# Patient Record
Sex: Female | Born: 1970
Health system: Northeastern US, Community
[De-identification: ages and names within clinical notes are randomized; demographics above are authoritative.]

## PROBLEM LIST (undated history)

## (undated) DIAGNOSIS — R51 Headache: Principal | ICD-10-CM

## (undated) DIAGNOSIS — R519 Headache, unspecified: Secondary | ICD-10-CM

## (undated) HISTORY — PX: SALPINGECTOMY COMPLETE/PARTIAL UNI/BI SPX: REP214

---

## 2016-07-01 ENCOUNTER — Telehealth (HOSPITAL_BASED_OUTPATIENT_CLINIC_OR_DEPARTMENT_OTHER): Payer: Self-pay | Admitting: Physician Assistant

## 2016-07-01 NOTE — Progress Notes (Signed)
Shawna from [employer] contacted the Central Refill Department to complete a benefit analysis for the Tdap Vaccine.    The vaccine is covered under the patient’s prescription coverage.    Please choose [id number] Tdap (Prior Auth/Pharmacy)

## 2016-07-10 ENCOUNTER — Ambulatory Visit (HOSPITAL_BASED_OUTPATIENT_CLINIC_OR_DEPARTMENT_OTHER): Payer: Medicaid Other | Admitting: Physician Assistant

## 2016-07-31 MED FILL — *METFORMIN  500MG ER: 30 days supply | Qty: 60 | Fill #1 | Status: CP

## 2016-07-31 MED FILL — *VITAMIN D3 2000UNIT: 30 days supply | Qty: 30 | Fill #9 | Status: CP

## 2016-07-31 MED FILL — TRI-PREVIFEM: 84 days supply | Qty: 84 | Fill #0 | Status: CP

## 2016-08-28 ENCOUNTER — Ambulatory Visit (HOSPITAL_BASED_OUTPATIENT_CLINIC_OR_DEPARTMENT_OTHER): Payer: Medicaid Other | Admitting: Physician Assistant

## 2016-09-07 ENCOUNTER — Emergency Department (HOSPITAL_BASED_OUTPATIENT_CLINIC_OR_DEPARTMENT_OTHER)
Admission: RE | Admit: 2016-09-07 | Disposition: A | Discharge status: Home / Self Care | Payer: Self-pay | Source: Emergency Department | Attending: Emergency Medicine | Admitting: Emergency Medicine

## 2016-09-07 ENCOUNTER — Encounter (HOSPITAL_BASED_OUTPATIENT_CLINIC_OR_DEPARTMENT_OTHER): Payer: Self-pay

## 2016-09-07 HISTORY — DX: Headache: R51

## 2016-09-07 HISTORY — DX: Headache, unspecified: R51.9

## 2016-09-07 LAB — POC URINALYSIS
BILIRUBIN, URINE: NEGATIVE
GLUCOSE,URINE: NEGATIVE
KETONE, URINE: NEGATIVE
LEUKOCYTE ESTERASE: NEGATIVE
NITRITE, URINE: NEGATIVE
PH URINE: 7 (ref 5.0–8.0)
PROTEIN, URINE: 100 — AB
SPECIFIC GRAVITY, URINE: 1.01 (ref 1.003–1.030)
UROBILINOGEN URINE: 0.2 (ref 0.2–1.0)

## 2016-09-07 LAB — CBC, PLATELET & DIFFERENTIAL
ABSOLUTE BASO COUNT: 0 10*3/uL (ref 0.0–0.1)
ABSOLUTE EOSINOPHIL COUNT: 0.1 10*3/uL (ref 0.0–0.8)
ABSOLUTE IMM GRAN COUNT: 0.01 10*3/uL (ref 0.00–0.03)
ABSOLUTE LYMPH COUNT: 2.3 10*3/uL (ref 0.6–5.9)
ABSOLUTE MONO COUNT: 0.5 10*3/uL (ref 0.2–1.4)
ABSOLUTE NEUTROPHIL COUNT: 4.3 10*3/uL (ref 1.6–8.3)
BASOPHIL %: 0.4 % (ref 0.0–1.2)
EOSINOPHIL %: 1.9 % (ref 0.0–7.0)
HEMATOCRIT: 36.8 % (ref 34.1–44.9)
HEMOGLOBIN: 12.8 g/dL (ref 11.2–15.7)
IMMATURE GRANULOCYTE %: 0.1 % (ref 0.0–0.4)
LYMPHOCYTE %: 31.2 % (ref 15.0–54.0)
MEAN CORP HGB CONC: 34.8 g/dL (ref 31.0–37.0)
MEAN CORPUSCULAR HGB: 29.8 pg (ref 26.0–34.0)
MEAN CORPUSCULAR VOL: 85.6 fL (ref 80.0–100.0)
MEAN PLATELET VOLUME: 11.4 fL (ref 8.7–12.5)
MONOCYTE %: 7 % (ref 4.0–13.0)
NEUTROPHIL %: 59.4 % (ref 40.0–75.0)
PLATELET COUNT: 273 10*3/uL (ref 150–400)
RBC DISTRIBUTION WIDTH STD DEV: 38.3 fL (ref 35.1–46.3)
RBC DISTRIBUTION WIDTH: 12.5 % (ref 11.5–14.3)
RED BLOOD CELL COUNT: 4.3 M/uL (ref 3.90–5.20)
WHITE BLOOD CELL COUNT: 7.3 10*3/uL (ref 4.0–11.0)

## 2016-09-07 LAB — BASIC METABOLIC PANEL
ANION GAP: 9 mmol/L (ref 5–15)
BUN (UREA NITROGEN): 8 mg/dL (ref 7–18)
CALCIUM: 8.4 mg/dL — ABNORMAL LOW (ref 8.5–10.1)
CARBON DIOXIDE: 27 mmol/L (ref 21–32)
CHLORIDE: 107 mmol/L (ref 98–107)
CREATININE: 0.8 mg/dL (ref 0.4–1.2)
ESTIMATED GLOMERULAR FILT RATE: >60 mL/min (ref >60)
Glucose Random: 96 mg/dL (ref 74–160)
POTASSIUM: 4.3 mmol/L (ref 3.5–5.1)
SODIUM: 143 mmol/L (ref 136–145)

## 2016-09-07 LAB — URINE PREGNANCY TEST (POINT OF CARE): HCG QUALITATIVE URINE: NEGATIVE

## 2016-09-07 LAB — CT HEAD WO CONTRAST

## 2016-09-07 MED ORDER — IBUPROFEN 600 MG PO TABS: 600 mg | tablet | Freq: Four times a day (QID) | ORAL | 0 refills | 0 days | Status: AC | PRN

## 2016-09-07 MED ORDER — ACETAMINOPHEN 500 MG PO TABS
1000.00 mg | ORAL_TABLET | Freq: Once | ORAL | Status: AC
Start: 2016-09-07 — End: 2016-09-07
  Administered 2016-09-07: 1000 mg via ORAL
  Filled 2016-09-07: qty 2

## 2016-09-07 MED ORDER — SODIUM CHLORIDE 0.9 % IV BOLUS
1000.0000 mL | Freq: Once | INTRAVENOUS | Status: AC
  Administered 2016-09-07: 1000 mL via INTRAVENOUS

## 2016-09-07 MED ORDER — METOCLOPRAMIDE HCL 10 MG PO TABS
10.00 mg | ORAL_TABLET | Freq: Four times a day (QID) | ORAL | 0 refills | Status: AC
Start: 2016-09-07 — End: 2016-09-14

## 2016-09-07 MED ORDER — METOCLOPRAMIDE HCL 10 MG PO TABS: 10 mg | tablet | Freq: Four times a day (QID) | ORAL | 0 refills | 0 days | Status: AC

## 2016-09-07 MED ORDER — DIPHENHYDRAMINE HCL 25 MG PO CAPS
25.00 mg | ORAL_CAPSULE | Freq: Four times a day (QID) | ORAL | 0 refills | Status: AC | PRN
Start: 2016-09-07 — End: 2016-10-07

## 2016-09-07 MED ORDER — ACETAMINOPHEN 325 MG PO TABS: 650 mg | tablet | Freq: Four times a day (QID) | ORAL | 0 refills | 0 days | Status: AC | PRN

## 2016-09-07 MED ORDER — DIPHENHYDRAMINE HCL 50 MG/ML IJ SOLN
25.00 mg | Freq: Once | INTRAMUSCULAR | Status: AC
Start: 2016-09-07 — End: 2016-09-07
  Administered 2016-09-07: 25 mg via INTRAVENOUS
  Filled 2016-09-07: qty 1

## 2016-09-07 MED ORDER — PROCHLORPERAZINE EDISYLATE 5 MG/ML IJ SOLN
10.00 mg | Freq: Once | INTRAMUSCULAR | Status: AC
Start: 2016-09-07 — End: 2016-09-07
  Administered 2016-09-07: 10 mg via INTRAVENOUS
  Filled 2016-09-07: qty 2

## 2016-09-07 MED ORDER — ACETAMINOPHEN 325 MG PO TABS
650.00 mg | ORAL_TABLET | Freq: Four times a day (QID) | ORAL | 0 refills | Status: AC | PRN
Start: 2016-09-07 — End: 2016-10-07

## 2016-09-07 MED ORDER — DIPHENHYDRAMINE HCL 25 MG PO CAPS: 25 mg | capsule | Freq: Four times a day (QID) | ORAL | 0 refills | 0 days | Status: AC | PRN

## 2016-09-07 MED ORDER — IBUPROFEN 600 MG PO TABS
600.00 mg | ORAL_TABLET | Freq: Four times a day (QID) | ORAL | 0 refills | Status: AC | PRN
Start: 2016-09-07 — End: 2016-10-07

## 2016-09-07 NOTE — Narrator Note (Signed)
MD applied O2 for pt comfort.

## 2016-09-07 NOTE — Narrator Note (Signed)
Patient Disposition    Patient education for diagnosis, medications, activity, diet and follow-up.  Patient left ED 11:51 AM.  Patient rep received written instructions.  Interpreter to provide instructions: No- pt had husband interpret.    Patient belongings with patient: YES    Have all existing LDAs been addressed? N/A    Have all IV infusions been stopped? N/A    Discharged to: Discharged to home

## 2016-09-07 NOTE — ED Provider Notes (Signed)
I performed a history and physical examination of the patient primarily.    Chief Complaint: Headache    HPI: This is a 45 year old female  presenting with headache..  The pain is located over the right side of the head and is worse with light and better with darkness.  Onset is gradual.  Patient has tried ibuprofen today for their symptoms without significant relief.   Arrives to the ED via private vehicle with her husband.  (-)N/V, (-) neck pain or stiffness, (-)fever, (-)weakness or paresthesias.  States that she has had similar headaches almost every day for a year and has never seen a physician for this problem    ROS: Pertinent positives were reviewed as per the HPI above. All other systems were reviewed and are negative.    Past Medical History:  Past Medical History:  No date: Headache disorder  There is no problem list on file for this patient.      Past Surgical History:  History reviewed. No pertinent surgical history.    Medications:     No current facility-administered medications on file prior to encounter.   No current outpatient prescriptions on file prior to encounter.    Social History:    Social History   Marital status: Married  Spouse name: N/A    Years of education: N/A  Number of children: N/A     Occupational History  None on file     Social History Main Topics   Smoking status: Never Smoker    Smokeless tobacco: Never Used    Alcohol use Not on file    Drug use: No    Sexual activity: Not on file     Other Topics Concern   None on file     Social History Narrative   None on file     The patient lives with family.      Allergies:  Review of Patient's Allergies indicates:  No Known Allergies    Physical Exam:  BP 132/80   Pulse 91   Temp 98.3 F   Resp 18   Wt 76 kg (167 lb 8.8 oz)   LMP 09/05/2016   SpO2 100%    Vital signs reviewed  General: Patient is uncomfortable appearing   Skin: No rash, good turgor  Eyes: NCAT, EOMI, No icterus, pupils equal  ENT: NL ENT inspection, neck supple, no  JVD, mucous membranes moist no temporal artery tenderness  Lymph: No palpable nodes  Resp: Lungs clear, no respiratory distress, good air entry  CV: RRR, NL S1S2  GI: Soft, non-tender, no guarding, rebound, or palpable mass.  MS: Non tender, no edema, Distal pulses 2+  Neuro: Gross motor normal, no focal findings, alert and awake.  Pupils reviewed.  GU: No CVAT  Psych: Normal affect    Diagnostics:  Pulse Oximetry: 100% on RA indicating adequate oxygenation.  CT Head:  NAD as read by radiology attending.  Labs reviewed    ED Course and Medical Decision-making:  Prior records reviewed.  Medication list reviewed.  Nursing note reviewed. Prior to obtaining the patient's history, performing the physical exam and reviewing the diagnostics, initial considerations based on the presenting problem included, but were not limited to ICH, cerebral venous sinus thrombosis, migraine, cluster HA, pseudotumor cerebri, carbon monoxide poisoning, tension HA, cervicogenic headache, glaucoma, neoplasm, vertebral artery or carotid dissection.    Patient was treated with IV fluids, reglan 10 mg IV, benadryl 25 mg IV, and toradol with improvement.  On final re-examination patient is stable and clinical status has significantly improved.  Non focal neurologic exam.  Emergent imaging of the brain is not clinically indicated at this time secondary to the chronicity of their symptoms and reassuring neurologic exam.  This is not a new type of headache for the patient.     Patient was instructed to follow up with their PCP or Neurologist in 1-2 days if no improvement and return to the ED for any concerning symptoms.     Reasons to return to the ED were reviewed in detail. The patient agrees with this plan and disposition.    Diagnoses:  Headache disorder      Condition: Stable, improved.    Disposition:  Discharge home.    Electronically signed by: Delorise Royals Jeanella Craze, DO, 09/07/2016 11:52 AM      This Emergency Department patient encounter  note was created using voice-recognition software and in real time during the ED visit. Please excuse any typographical errors that have not been edited out.

## 2016-09-07 NOTE — Narrator Note (Signed)
MD at bedside. 

## 2016-09-07 NOTE — ED Triage Note (Signed)
Pt self presents with 10/10 headache. Pt has had headache for 1 year. Headache worse over the past several days. No fevers or trauma.

## 2016-09-07 NOTE — Narrator Note (Signed)
Pt to BR with a steady gait.

## 2016-09-07 NOTE — Narrator Note (Signed)
Pt removed O2

## 2016-09-07 NOTE — Narrator Note (Signed)
Blood sent to the LAB.

## 2016-09-07 NOTE — Narrator Note (Signed)
Pt to CT

## 2016-09-07 NOTE — Narrator Note (Signed)
Pt return from CT.

## 2016-09-10 ENCOUNTER — Encounter (HOSPITAL_BASED_OUTPATIENT_CLINIC_OR_DEPARTMENT_OTHER): Payer: Self-pay | Admitting: Family Medicine

## 2016-09-10 ENCOUNTER — Telehealth (HOSPITAL_BASED_OUTPATIENT_CLINIC_OR_DEPARTMENT_OTHER): Payer: Self-pay | Admitting: Family Medicine

## 2016-09-10 ENCOUNTER — Ambulatory Visit (HOSPITAL_BASED_OUTPATIENT_CLINIC_OR_DEPARTMENT_OTHER): Payer: Medicaid Other | Admitting: Family Medicine

## 2016-09-10 VITALS — BP 124/80 | HR 69 | Temp 98.9°F | Ht 63.27 in | Wt 173.0 lb

## 2016-09-10 DIAGNOSIS — F411 Generalized anxiety disorder: Secondary | ICD-10-CM

## 2016-09-10 DIAGNOSIS — R519 Headache, unspecified: Secondary | ICD-10-CM

## 2016-09-10 DIAGNOSIS — Z7189 Other specified counseling: Secondary | ICD-10-CM

## 2016-09-10 DIAGNOSIS — R51 Headache: Principal | ICD-10-CM

## 2016-09-10 MED ORDER — KETOROLAC TROMETHAMINE 30 MG/ML (FOR FAM USE)
30.0000 mg | Freq: Once | INTRAMUSCULAR | Status: DC
Start: 2016-09-10 — End: 2016-09-10

## 2016-09-10 NOTE — Telephone Encounter (Signed)
-----   Message from WebberBetsy D. Lemus sent at 09/10/2016 12:46 PM EDT -----  Regarding: benefit analysis for Tdap  This pt have an appointment on today with Dr Harlow AsaSunder. We need benefit analysis for tdap.     Thanks     CambriaBetsy, LPN

## 2016-09-10 NOTE — Progress Notes (Signed)
Preceptor Note  I personally reviewed this case, along with the patient's history and exam findings, with Dr. Harlow AsaSunder. I confirm the findings, and agree with the assessment and plan, as documented in the visit note.  Christy Palmer is a 45 year old female is a new patient with almost daily HA for last 5 years - likely tension type with medication overuse component.  Toradol today, limit OTC pain medication when possible, recent head CT negative and nl neuro exam per resident - no imaging required at this time, headache diary, give info re: triggers to work on, refer to neuro to consider prevention, further workup    Christy Barg P. Theora Vankirk,MD

## 2016-09-10 NOTE — Progress Notes (Unsigned)
Betsy from [facility] called the Central Refill Department to complete a benefit analysis for the TDAP Vaccine.    The vaccine is covered under the patient’s prescription coverage.    Please choose 90715.2  (Prior Auth/Pharmacy)
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
## 2016-09-15 DIAGNOSIS — F411 Generalized anxiety disorder: Secondary | ICD-10-CM | POA: Insufficient documentation

## 2016-09-15 DIAGNOSIS — R51 Headache: Principal | ICD-10-CM

## 2016-09-15 DIAGNOSIS — R519 Headache, unspecified: Secondary | ICD-10-CM | POA: Insufficient documentation

## 2016-09-21 MED FILL — *METFORMIN  500MG ER: 30 days supply | Qty: 60 | Fill #2 | Status: CP

## 2016-09-21 MED FILL — *VITAMIN D3 2000UNIT: 30 days supply | Qty: 30 | Fill #10 | Status: CP

## 2016-09-25 ENCOUNTER — Encounter (HOSPITAL_BASED_OUTPATIENT_CLINIC_OR_DEPARTMENT_OTHER): Payer: Self-pay | Admitting: Family Medicine

## 2016-09-25 ENCOUNTER — Ambulatory Visit (HOSPITAL_BASED_OUTPATIENT_CLINIC_OR_DEPARTMENT_OTHER): Payer: Medicaid Other | Admitting: Family Medicine

## 2016-09-25 VITALS — BP 102/70 | HR 78 | Temp 97.3°F | Wt 173.0 lb

## 2016-09-25 DIAGNOSIS — H9191 Unspecified hearing loss, right ear: Secondary | ICD-10-CM

## 2016-09-25 DIAGNOSIS — M26609 Unspecified temporomandibular joint disorder, unspecified side: Secondary | ICD-10-CM

## 2016-09-25 DIAGNOSIS — R519 Headache, unspecified: Secondary | ICD-10-CM

## 2016-09-25 DIAGNOSIS — R0789 Other chest pain: Secondary | ICD-10-CM

## 2016-09-25 DIAGNOSIS — F411 Generalized anxiety disorder: Secondary | ICD-10-CM

## 2016-09-25 DIAGNOSIS — R51 Headache: Principal | ICD-10-CM
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
## 2016-09-26 ENCOUNTER — Encounter (HOSPITAL_BASED_OUTPATIENT_CLINIC_OR_DEPARTMENT_OTHER): Payer: Self-pay | Admitting: Family Medicine

## 2016-09-28 LAB — EKG

## 2016-10-02 ENCOUNTER — Ambulatory Visit: Admit: 2016-10-02 | Discharge: 2016-10-02 | Disposition: A | Discharge status: Home / Self Care | Payer: Self-pay

## 2016-10-16 ENCOUNTER — Ambulatory Visit (HOSPITAL_BASED_OUTPATIENT_CLINIC_OR_DEPARTMENT_OTHER): Payer: Medicaid Other | Admitting: Neurology

## 2016-10-16 VITALS — BP 117/78 | HR 72 | Temp 98.3°F | Wt 174.0 lb

## 2016-10-16 DIAGNOSIS — M5481 Occipital neuralgia: Principal | ICD-10-CM

## 2016-10-16 MED ORDER — GABAPENTIN 300 MG PO CAPS
300.0000 mg | ORAL_CAPSULE | Freq: Three times a day (TID) | ORAL | 1 refills | Status: DC
Start: 2016-10-16 — End: 2017-01-13

## 2016-10-16 MED ORDER — GABAPENTIN 300 MG PO CAPS: 300 mg | capsule | Freq: Three times a day (TID) | ORAL | 1 refills | 0 days | Status: DC

## 2016-10-16 MED FILL — *GABAPENTIN 300 MG: 10 days supply | Qty: 30 | Fill #0 | Status: CP
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
## 2016-10-29 ENCOUNTER — Ambulatory Visit (HOSPITAL_BASED_OUTPATIENT_CLINIC_OR_DEPARTMENT_OTHER): Payer: Medicaid Other | Admitting: Clinical Cardiac Electrophysiology

## 2016-11-01 ENCOUNTER — Ambulatory Visit (HOSPITAL_BASED_OUTPATIENT_CLINIC_OR_DEPARTMENT_OTHER): Payer: Medicaid Other | Admitting: Family Medicine

## 2016-11-06 ENCOUNTER — Ambulatory Visit (HOSPITAL_BASED_OUTPATIENT_CLINIC_OR_DEPARTMENT_OTHER): Payer: Medicaid Other | Admitting: Family Medicine

## 2016-11-09 ENCOUNTER — Encounter (HOSPITAL_BASED_OUTPATIENT_CLINIC_OR_DEPARTMENT_OTHER): Payer: Self-pay | Admitting: Neurology

## 2016-11-10 NOTE — Telephone Encounter (Signed)
Med on file with Pharmacy:  Confirmed that GABAPENTIN has active refills remaining. No refill is required at this time.

## 2016-11-10 NOTE — Telephone Encounter (Signed)
Message from MyChart:  Christy PennaEcilmaria Earlywine would like a refill of the following medications:  gabapentin (NEURONTIN) 300 MG capsule Sharyon Medicus[Hong Yu, MD, MD]    [facility]    Comment:

## 2016-11-11 ENCOUNTER — Ambulatory Visit (HOSPITAL_BASED_OUTPATIENT_CLINIC_OR_DEPARTMENT_OTHER): Payer: Medicaid Other | Admitting: Family Medicine

## 2016-11-11 ENCOUNTER — Telehealth (HOSPITAL_BASED_OUTPATIENT_CLINIC_OR_DEPARTMENT_OTHER): Payer: Self-pay | Admitting: Family Medicine

## 2016-11-11 NOTE — Progress Notes (Signed)
Gail from [facility] called the Central Refill Department to complete a benefit analysis for the TDAP Vaccine.    The vaccine is covered under the patient’s prescription coverage.    Please choose 90715.2 BOOSTRIX (Prior Auth/Pharmacy)

## 2016-11-14 MED FILL — METFORMIN  500MG ER: 30 days supply | Qty: 60 | Fill #3 | Status: CP

## 2016-11-14 MED FILL — TRI-PREVIFEM: 84 days supply | Qty: 84 | Fill #1 | Status: CP

## 2016-11-14 MED FILL — VITAMIN D3 2000UNIT: 30 days supply | Qty: 30 | Fill #0 | Status: CP

## 2016-12-16 ENCOUNTER — Ambulatory Visit (HOSPITAL_BASED_OUTPATIENT_CLINIC_OR_DEPARTMENT_OTHER): Payer: Medicaid Other | Admitting: Neurology

## 2016-12-17 ENCOUNTER — Ambulatory Visit (HOSPITAL_BASED_OUTPATIENT_CLINIC_OR_DEPARTMENT_OTHER): Payer: Medicaid Other | Admitting: Audiologist-Hearing Aid Fitter

## 2016-12-24 ENCOUNTER — Other Ambulatory Visit (HOSPITAL_BASED_OUTPATIENT_CLINIC_OR_DEPARTMENT_OTHER): Payer: Self-pay | Admitting: Family Medicine

## 2016-12-24 DIAGNOSIS — Z1239 Encounter for other screening for malignant neoplasm of breast: Principal | ICD-10-CM

## 2017-01-01 ENCOUNTER — Ambulatory Visit: Payer: Self-pay | Admitting: Family Medicine

## 2017-01-10 ENCOUNTER — Emergency Department (HOSPITAL_BASED_OUTPATIENT_CLINIC_OR_DEPARTMENT_OTHER)
Admission: RE | Admit: 2017-01-10 | Disposition: A | Discharge status: Home / Self Care | Payer: Self-pay | Source: Emergency Department | Attending: Emergency Medicine | Admitting: Emergency Medicine

## 2017-01-10 ENCOUNTER — Encounter (HOSPITAL_BASED_OUTPATIENT_CLINIC_OR_DEPARTMENT_OTHER): Payer: Self-pay

## 2017-01-10 LAB — XR CHEST 2 VIEWS

## 2017-01-10 LAB — COMPREHENSIVE METABOLIC PANEL
ALANINE AMINOTRANSFERASE: 33 U/L (ref 12–45)
ALBUMIN: 3.8 g/dL (ref 3.4–5.0)
ALKALINE PHOSPHATASE: 108 U/L (ref 45–117)
ANION GAP: 11 mmol/L (ref 5–15)
ASPARTATE AMINOTRANSFERASE: 18 U/L (ref 8–34)
BILIRUBIN TOTAL: 0.4 mg/dL (ref 0.2–1.0)
BUN (UREA NITROGEN): 11 mg/dL (ref 7–18)
CALCIUM: 8.4 mg/dL — ABNORMAL LOW (ref 8.5–10.1)
CARBON DIOXIDE: 28 mmol/L (ref 21–32)
CHLORIDE: 103 mmol/L (ref 98–107)
CREATININE: 0.8 mg/dL (ref 0.4–1.2)
ESTIMATED GLOMERULAR FILT RATE: >60 mL/min (ref >60)
Glucose Random: 96 mg/dL (ref 74–160)
POTASSIUM: 3.9 mmol/L (ref 3.5–5.1)
SODIUM: 142 mmol/L (ref 136–145)
TOTAL PROTEIN: 7.1 g/dL (ref 6.4–8.2)

## 2017-01-10 LAB — CBC, PLATELET & DIFFERENTIAL
ABSOLUTE BASO COUNT: 0 10*3/uL (ref 0.0–0.1)
ABSOLUTE EOSINOPHIL COUNT: 0.1 10*3/uL (ref 0.0–0.8)
ABSOLUTE IMM GRAN COUNT: 0.02 10*3/uL (ref 0.00–0.03)
ABSOLUTE LYMPH COUNT: 1 10*3/uL (ref 0.6–5.9)
ABSOLUTE MONO COUNT: 1.1 10*3/uL (ref 0.2–1.4)
ABSOLUTE NEUTROPHIL COUNT: 3.8 10*3/uL (ref 1.6–8.3)
BASOPHIL %: 0.3 % (ref 0.0–1.2)
EOSINOPHIL %: 1.3 % (ref 0.0–7.0)
HEMATOCRIT: 37.3 % (ref 34.1–44.9)
HEMOGLOBIN: 12.9 g/dL (ref 11.2–15.7)
IMMATURE GRANULOCYTE %: 0.3 % (ref 0.0–0.4)
LYMPHOCYTE %: 16.1 % (ref 15.0–54.0)
MEAN CORP HGB CONC: 34.6 g/dL (ref 31.0–37.0)
MEAN CORPUSCULAR HGB: 29.6 pg (ref 26.0–34.0)
MEAN CORPUSCULAR VOL: 85.6 fL (ref 80.0–100.0)
MEAN PLATELET VOLUME: 11.5 fL (ref 8.7–12.5)
MONOCYTE %: 18.1 % — ABNORMAL HIGH (ref 4.0–13.0)
NEUTROPHIL %: 63.9 % (ref 40.0–75.0)
PLATELET COUNT: 255 10*3/uL (ref 150–400)
RBC DISTRIBUTION WIDTH STD DEV: 37 fL (ref 35.1–46.3)
RBC DISTRIBUTION WIDTH: 12.2 % (ref 11.5–14.3)
RED BLOOD CELL COUNT: 4.36 M/uL (ref 3.90–5.20)
WHITE BLOOD CELL COUNT: 6 10*3/uL (ref 4.0–11.0)

## 2017-01-10 LAB — TROPONIN I: TROPONIN I: <0.02 ng/mL (ref 0.00–0.04)

## 2017-01-10 LAB — INFLUENZA A AND B PCR: INFLUENZA A and B PCR: NEGATIVE

## 2017-01-10 LAB — HOLD BLUE TOP TUBE

## 2017-01-10 LAB — LIPASE: LIPASE: 63 U/L — ABNORMAL LOW (ref 73–393)

## 2017-01-10 LAB — HCG QUALITATIVE SERUM: HCG QUALITATIVE SERUM: NEGATIVE

## 2017-01-10 LAB — NT-PROBNP: NT-proBNP: 52 pg/mL (ref 0–125)

## 2017-01-10 MED ORDER — SODIUM CHLORIDE 0.9 % IV BOLUS
1000.0000 mL | Freq: Once | INTRAVENOUS | Status: AC
Start: 2017-01-10 — End: 2017-01-10
  Administered 2017-01-10: 1000 mL via INTRAVENOUS

## 2017-01-10 MED ORDER — OSELTAMIVIR PHOSPHATE 75 MG PO CAPS
75.0000 mg | ORAL_CAPSULE | Freq: Two times a day (BID) | ORAL | 0 refills | Status: DC
Start: 2017-01-10 — End: 2017-01-13

## 2017-01-10 MED ORDER — IBUPROFEN 600 MG PO TABS: 600 mg | tablet | Freq: Four times a day (QID) | ORAL | 0 refills | 0 days | Status: AC | PRN

## 2017-01-10 MED ORDER — BENZONATATE 100 MG PO CAPS
100.00 mg | ORAL_CAPSULE | Freq: Once | ORAL | Status: AC
Start: 2017-01-10 — End: 2017-01-10
  Administered 2017-01-10: 100 mg via ORAL
  Filled 2017-01-10: qty 1

## 2017-01-10 MED ORDER — RANITIDINE HCL 150 MG PO TABS
150.0000 mg | ORAL_TABLET | Freq: Two times a day (BID) | ORAL | 0 refills | Status: DC
Start: 2017-01-10 — End: 2017-01-13

## 2017-01-10 MED ORDER — BENZONATATE 100 MG PO CAPS
100.00 mg | ORAL_CAPSULE | Freq: Three times a day (TID) | ORAL | 0 refills | Status: AC | PRN
Start: 2017-01-10 — End: 2017-01-15

## 2017-01-10 MED ORDER — RANITIDINE HCL 150 MG PO TABS: 150 mg | tablet | Freq: Two times a day (BID) | ORAL | 0 refills | 0 days | Status: DC

## 2017-01-10 MED ORDER — KETOROLAC TROMETHAMINE 30 MG/ML INJ
30.0000 mg | Freq: Once | Status: AC
Start: 2017-01-10 — End: 2017-01-10
  Administered 2017-01-10: 30 mg via INTRAVENOUS
  Filled 2017-01-10: qty 1

## 2017-01-10 MED ORDER — FAMOTIDINE 20 MG/2ML IV SOLN
20.00 mg | Freq: Once | INTRAVENOUS | Status: AC
Start: 2017-01-10 — End: 2017-01-10
  Administered 2017-01-10: 20 mg via INTRAVENOUS
  Filled 2017-01-10: qty 2

## 2017-01-10 MED ORDER — IBUPROFEN 600 MG PO TABS
600.00 mg | ORAL_TABLET | Freq: Four times a day (QID) | ORAL | 0 refills | Status: AC | PRN
Start: 2017-01-10 — End: 2017-02-09

## 2017-01-10 MED ORDER — ACETAMINOPHEN 325 MG PO TABS: 975 mg | tablet | Freq: Four times a day (QID) | ORAL | 0 refills | 0 days | Status: AC | PRN

## 2017-01-10 MED ORDER — ACETAMINOPHEN 325 MG PO TABS
975.0000 mg | ORAL_TABLET | Freq: Once | ORAL | Status: AC
Start: 2017-01-10 — End: 2017-01-10
  Administered 2017-01-10: 975 mg via ORAL
  Filled 2017-01-10: qty 3

## 2017-01-10 MED ORDER — BENZONATATE 100 MG PO CAPS: 100 mg | capsule | Freq: Three times a day (TID) | ORAL | 0 refills | 0 days | Status: AC | PRN

## 2017-01-10 MED ORDER — OSELTAMIVIR PHOSPHATE 75 MG PO CAPS: 75 mg | capsule | Freq: Two times a day (BID) | ORAL | 0 refills | 0 days | Status: DC

## 2017-01-10 MED ORDER — ACETAMINOPHEN 325 MG PO TABS
975.00 mg | ORAL_TABLET | Freq: Four times a day (QID) | ORAL | 0 refills | Status: AC | PRN
Start: 2017-01-10 — End: 2017-01-20

## 2017-01-10 MED ORDER — LIDOCAINE VISCOUS 2 % MT SOLN
10.0000 mL | Freq: Once | OROMUCOSAL | Status: AC
Start: 2017-01-10 — End: 2017-01-10
  Administered 2017-01-10: 10 mL via OROMUCOSAL
  Filled 2017-01-10: qty 15

## 2017-01-10 MED ORDER — ALUMINUM & MAGNESIUM HYDROXIDE 200-200 MG/5ML PO SUSP
30.0000 mL | Freq: Once | ORAL | Status: AC
Start: 2017-01-10 — End: 2017-01-10
  Administered 2017-01-10: 30 mL via ORAL
  Filled 2017-01-10: qty 30

## 2017-01-10 NOTE — ED Provider Notes (Signed)
The patient was seen primarily by me. ED nursing record was reviewed. Select prior records as available electronically through the Epic record were reviewed.    History, physical exam, and disposition planning were conducted with an official hospital [place] interpreter.    HPI:    Christy Palmer is a 46 year old female patient who has a past medical history of Headache disorder.     The pt presents c/o B anterior cp x 1 month suddenly worse in last two days. As of two days ago developed cough, DOE, intermittent SOB, nausea without vomiting but post-tussive emesis, HA worse with cough, subjective fevers/chills, myalgias. Feels she cannot lay on her L side due to pain in L chest wall also felt on palpation. No sore throat.    Lives with husband; no sick household contacts. Works as Landhousecleaner.    ROS: Pertinent positives were reviewed as per the HPI above. All other systems were reviewed and are negative.  Christy Palmer  Language of care: [place]  MRN: [id number]  PCP: Zack SealMeera Sunder, MD  Mode of arrival to ED: Relative.  Arrival time: 01/10/2017  8:23 AM  Chief complaint: Chest Pain (CHEST PAIN)    Past Medical History/Problem list:  Past Medical History:  No date: Headache disorder  Patient Active Problem List:     Anxiety state     Headache disorder    Past Surgical History: Past Surgical History:  No date: CESAREAN SECTION, LOW TRANSVERSE  No date: SALPINGECTOMY COMPLETE/PARTIAL UNI/BI SPX      Comment: HO PEC  Social History:     Social History  Social History   Marital status: Married  Spouse name: N/A    Years of education: N/A  Number of children: N/A     Occupational History  None on file     Social History Main Topics   Smoking status: Never Smoker    Smokeless tobacco: Never Used    Alcohol use No    Drug use: No    Sexual activity: Not on file     Other Topics Concern   None on file     Social History Narrative    Here for 5 months    Here from [place]    House cleaner and  restaurant          Allergies: Review of Patient's Allergies indicates:  No Known Allergies    Immunizations:   There is no immunization history on file for this patient.       Medications:  Prior to Admission Medications   Prescriptions Last Dose Informant Patient Reported? Taking?   gabapentin (NEURONTIN) 300 MG capsule   No No   Sig: Take 1 capsule by mouth 3 (three) times daily      Facility-Administered Medications: None     Physical Exam [facility]  Patient Vitals for the past 99 hrs:   BP Temp Pulse Resp SpO2 Weight   01/10/17 0830 131/81 98.7 F 88 18 100 % 77.1 kg (170 lb)     GENERAL:  WDWN, no acute distress, non-toxic; occasional cough  SKIN:  Warm & Dry, no rash, no petechiae or purpurae.  HEAD:  NCAT. Sclerae are anicteric and aninjected, oropharynx with cobblestoning and slightly dry mucous membranes. PERRL. EOMI. B TMs clear.  NECK:  Supple, no LAN, no meningismus.  LUNGS:  Clear to auscultation bilaterally. No wheezes, rales, rhonchi.   CHEST WALL:  No discoloration/ecchymoses/erythema. TTP L anterolateral chest wall ~ribs8-9 without  underlying crepitus.   HEART:  RRR.  No murmurs, rubs, or gallops.   ABDOMEN:  Soft, NTND.  No masses.    EXTREMITIES:  No obvious deformities.  No cyanosis, no edema. Negative Homans B.  GENITOURINARY:  No CVA tenderness B.  NEUROLOGIC:  Alert; moves all extremities; speaking in sentences. CNsII-XII symmetrical and intact.  PSYCHIATRIC:  Appropriate for age, time of day, and situation    Medications Given in the ED:    Medications   sodium chloride 0.9 % IV bolus 1,000 mL (0 mLs Intravenous Stopped 01/10/17 1107)   ketorolac (TORADOL) injection 30 mg (30 mg Intravenous Given 01/10/17 0939)   acetaminophen (TYLENOL) tablet 975 mg (975 mg Oral Given 01/10/17 0939)   famotidine (PEPCID) injection 20 mg (20 mg Intravenous Given 01/10/17 0939)   lidocaine (XYLOCAINE) 2 % viscous solution 10 mL (10 mLs Mouth/Throat Given 01/10/17 0939)   aluminum-magnesium hydroxide  (MAALOX) 200-200 mg/5 mL suspension 30 mL (30 mLs Oral Given 01/10/17 0939)   benzonatate (TESSALON) capsule 100 mg (100 mg Oral Given 01/10/17 0939)    Radiology Results:  - CXR: No acute cardiopulmonary pathology   Lab Results:   -142/3.9/103/28/11/0.A <96  -Calcium 8.4  -LFTs normal  -Anti-proBNP 52  -Troponin <0.02 (single value after > 12 hours sxs)  -Lipase 63  -HCG negative  -6.0 > 37 not 3 < 255  -Rapid flu POSITIVE for influenza B     Other Results and OLD/PRIOR records information and data (e.g. ECG, visual acuity):  - ECG on my reading: NSR, nl axis/intervals, no evidence of chamber enlargement or ischemia.TWI iii only. QTc 449.  No change from November 2017.     ED Course and Medical Decision-making:  46 year old female patient with one month chronic cp now with two days constellation of sxs including cough, intermittent SOB, DOE, subjective fever, nausea, post-tussive emesis, HA, myalgias -- flu-like illness. Also with L chest wall pain, likely intercostal muscle tear related to coughing.    ECG without acute findings.  CXR without acute findings.    Started on IVF, ketorolac, APAP; "GI cocktail," famotidine; benzonatate.    Serum testing without acute metabolic derangement; rapid flu +influenza B.    Pt is outside of effective treatment window for oseltamivir. Plan is for follow-up with [facility] (pt's primary care team) in 3 days after the weekend (today is a Friday) -- appointment arranged by ED.    Patient/family educated on diagnosis(es); she and her husband state understanding and agreement with plan of care. Reasons to return to the ED were reviewed in detail. She and her husband agree with this plan and disposition.    Condition on Discharge: Improved and Stable    Diagnosis/Diagnoses:  Influenza B  Flu-like symptoms  Cough  Non-intractable vomiting with nausea, unspecified vomiting type  Acute nonintractable headache, unspecified headache type  Chills  Intercostal muscle tear,  initial encounter    Discharge Prescriptions:   Discharge Medication List as of 01/10/2017  1:30 PM    START taking these medications    ibuprofen (ADVIL,MOTRIN) 600 MG tablet  Take 1 tablet by mouth every 6 (six) hours as needed for Pain Take with food!  Tamperproof, Disp-20 tablet, R-0    acetaminophen (TYLENOL) 325 MG tablet  Take 3 tablets by mouth every 6 (six) hours as needed for Pain  Tamperproof, Disp-45 tablet, R-0    oseltamivir (TAMIFLU) 75 MG capsule  Take 1 capsule by mouth 2 (two) times daily  Tamperproof, Disp-10 capsule, R-0    raNITIdine (ZANTAC) 150 MG tablet  Take 1 tablet by mouth 2 (two) times daily  Tamperproof, Disp-60 tablet, R-0    benzonatate (TESSALON PERLES) 100 MG capsule  Take 1-2 capsules by mouth every 8 (eight) hours as needed for Cough for cough  Tamperproof, Disp-15 capsule, R-0      Kaylamarie Swickard Lai-Becker MD Lacie Scotts  This Emergency Department patient encounter note was created using voice-recognition software and in real time during the ED visit.

## 2017-01-10 NOTE — ED Notes (Signed)
XR @ 8:38

## 2017-01-10 NOTE — ED Notes (Signed)
XR 11:33

## 2017-01-10 NOTE — Narrator Note (Signed)
Report from Tim P, RN

## 2017-01-10 NOTE — Narrator Note (Signed)
Report to Mackenzie RN.

## 2017-01-10 NOTE — ED Triage Note (Signed)
Pt self presents and reports 1 month of chest pain which is worsening. Pain is a squeezing and starts on left chest wall and radiates to her back . + SOB, + nausea. + headache. Pt has seen her PCP and was referred to cardiology but never went to the appointment. Pt is awake and appropriate.Husband at bedside. EKG in process.

## 2017-01-10 NOTE — Narrator Note (Signed)
Pt awaiting d/c instructions.  Pt's IV removed by pt request.

## 2017-01-10 NOTE — Narrator Note (Signed)
Pt resting comfortably in stretcher lying on her right side.  Pt states pain 8/10 unchanged from pain medications. Vss. Up for re eval.

## 2017-01-10 NOTE — Narrator Note (Signed)
Pt medicated for pain.

## 2017-01-10 NOTE — Narrator Note (Signed)
Pt amb to xray

## 2017-01-10 NOTE — Discharge Instructions (Signed)
REVIEWED WITH [place] INTERPRETER/TRANSLATED WITH GOOGLE TRANSLATE    What we did in the Emergency Department (ED):    - chest x-ray: normal -- no pneumonia    - electrocardiogram:  normal    - rapid flu test: you have influenza    - pain/fever medication: Tylenol (acetaminophen), ketorolac (TORADOL)  - cough medication: benzonatate (TESSALON)  - medicine to decrease pain from acid in stomach: Lidocaine, MAALOX, famotidine (PEPCID)    Next steps:    - Oseltamivir (TAMIFLU) to treat flu virus --take twice a day for 5 days    - Ranitidine (ZANTAC) twice a day to settle stomach and decrease acid and stomach    - If needed, alternate doses of Tylenol (acetaminophen) with Motrin/Advil (ibuprofen) to control fever/pain.  Wait 6 hours between the SAME kind of medication.  An example schedule follows:       6:00am   Motrin/Advil (ibuprofen)  600 MG as prescribed, TAKE WITH FOOD!     9:00am   Tylenol (acetaminophen)  975 MG = 3 REGULAR strength tablets     12:00noon   Motrin/Advil     3:00pm   Tylenol     6:00pm   Motrin/Advil     9:00pm   Tylenol     (NEVER MORE THAN THREE (3) DOSES TYLENOL in one day as too much Tylenol will poison your liver!!!)    - Cough medicine: TESSALON PERLES (benzonatate capsules)    - DRINK FLUIDS! Drink enough that you pee every 4 hours and your urine is light clear pale yellow in color.  - NO Alcohol - will dehydrate you by making you pee out more than you drink  - NO Caffeine - will dehydrate you by making you pee out more than you drink    - rest and sleep    - NO WORK for 3 days -- work note provided    Come back to the Emergency Department (ED) for:  Can't breathe, coughing up blood, chest pain not associated with coughing, persistent nausea/vomiting/diarrhea, fever not responding to Tylenol or ibuprofen after one hour, can't drink, NO PEE production in 6 hours.    Thank you for your patience.    ---  REVISADO COM INTERPRETE Portug[place] / TRADUZIDO COM GOOGLE TRANSLATE    O que fizemos no  Departamento de Museum/gallery exhibitions officer (ED):    - Raio X do trax: normal - sem pneumonia    - eletrocardiograma: normal    - teste rpido de gripe: voc tem influenza    - medicao de dor / febre: Tylenol (acetaminophen), ketorolac (TORADOL)  - medicao contra a tosse: benzonato (TESSALON)  - medicamento para diminuir a dor do cido no estmago: Lidocana, MAALOX, famotidina (PEPCID)    Prximos passos:    - Oseltamivir (TAMIFLU) para tratar o vrus da gripe - pegue duas vezes ao dia durante 5 dias    - Ranitidina (ZANTAC) duas vezes ao dia para colonizar o estmago e diminuir o cido e o estmago    - Se necessrio, doses alternativas de Tylenol (acetaminofeno) com Motrin / Advil (ibuprofeno) para controlar a febre / dor. Espere 6 horas entre o mesmo tipo de [place]. Um exemplo de agendamento segue:    6:00 am Motrin / Advil (ibuprofeno) 600 MG conforme prescrito, TOME COM ALIMENTO!  9:00 am Tylenol (acetaminophen) 975 MG = 3 comprimidos de fora REGULAR  12:00 horas Motrin / Advil  3:00 da tarde Tylenol  6:00 da manh Motrin / Advil  9:00 da tarde  Tylenol  (NENHUM MAIS DO QUE TRS (3) DOSES TYLENOL em um dia como muito Tylenol envenenar seu fgado !!!)    - Remdio para a tosse: TESSALON PERLES (cpsulas de benzonato)    - FLUIDOS DE BEBIDA Beba o suficiente para fazer xixi a cada 4 horas e sua urina  clara e clara de cor amarelo plido.  - NO lcool - ir desidrat-lo, fazendo voc fazer xixi mais do que voc bebe  - SEM cafena - ir desidrat-lo, fazendo voc fazer xixi mais do que voc bebe    - descanse e durma    - SEM TRABALHO por 3 dias - nota de trabalho fornecida    Volte para o Departamento de Museum/gallery exhibitions officermergncia (ED) para: No pode respirar, tossir sangue, dor torcica no associada a tosse, nuseas persistentes / vmitos / diarria, febre no respondendo a Tylenol ou ibuprofeno aps uma hora, no pode beber, NO PEE produo em 6 horas.    Obrigado pela sua pacincia.

## 2017-01-13 ENCOUNTER — Encounter (HOSPITAL_BASED_OUTPATIENT_CLINIC_OR_DEPARTMENT_OTHER): Payer: Self-pay | Admitting: Family Medicine

## 2017-01-13 ENCOUNTER — Ambulatory Visit (HOSPITAL_BASED_OUTPATIENT_CLINIC_OR_DEPARTMENT_OTHER): Payer: Medicaid Other | Admitting: Family Medicine

## 2017-01-13 VITALS — BP 130/80 | HR 93 | Temp 96.6°F | Wt 176.8 lb

## 2017-01-13 DIAGNOSIS — R079 Chest pain, unspecified: Secondary | ICD-10-CM

## 2017-01-13 DIAGNOSIS — J101 Influenza due to other identified influenza virus with other respiratory manifestations: Principal | ICD-10-CM

## 2017-01-13 NOTE — Progress Notes (Signed)
[facility]  Office Visit Note   Patient presents with:  ER F/U    Subjective:   Christy Palmer is a 46 year old female patient who presents today for ED f/u    1. Influenza  -seen in ED and tested positive for influenza B  -CXR was negative  -ruled out for MI--ekg/troponins  -prescribed oseltamivir and tessalon perles--taking the tessalon but never got the oseltamivir  -still having L sided rib/chest discomfort  2. Chest pain x2 months  -sharp, deep, brief pains that starts under L breast  -sometimes goes to back  -feels that it is getting worse--more severe and more frequent  -does not hurt with movement  -does hurt sometimes with deep breathing  -all of this preceded the flu      ROS: fevers have resolved; no abd pain, N, V  She reports that she has never smoked. She has never used smokeless tobacco.    Objective:   BP 130/80  Pulse 93  Temp 96.6 F (35.9 C) (Temporal)  Wt 80.2 kg (176 lb 12.8 oz)  LMP 01/06/2017  SpO2 99%  BMI 31.05 kg/m2  General: alert and no distress  Eyes: Conjunctiva Clear  Cardio: regular rate and rhythm and S1, S2 normal  Chest: no chest wall TTP  Pulmonary: clear to auscultation and no wheezing  GI: soft, non-tender. Bowel sounds normal.  MS: negative; no joint swelling or TTP  Skin: skin color, texture, turgor are normal, No rashes or lesions  Psych: interactive and Normal Mood   Neuro: Alert and oriented X 3. Strength grossly intact. Normal coordination and gait.    Assessment & Plan:      Diagnosis:  Influenza B  (primary encounter diagnosis)  Chest pain, unspecified type   No orders of the defined types were placed in this encounter.         Symptoms of flu are improving.  She never started the Tamiflu and may hold off for now.  Ongoing L sided severe brief episodes of chest pain that start in the L midclavicular line just below L breast and then radiate to just below L scapula--could be a cutaneous nerve entrapment and may benefit from trigger point injection.      1.  The patient indicates understanding of these issues and agrees with the plan.  2. The patient is given an After Visit Summary sheet that lists all of their medications with directions, their allergies, orders placed during this encounter, immunization dates, and follow- up instructions.  3. I reviewed the patient's medical information and medical history   4. The patient expressed understanding and no barriers to adherence were identified.  5. I have reviewed the past medical, family, and social history sections including the medications and allergies listed in the above medical record    Sherril CongOmega Bonnell-Bradley, PA-C  01/13/2017

## 2017-01-14 ENCOUNTER — Encounter (HOSPITAL_BASED_OUTPATIENT_CLINIC_OR_DEPARTMENT_OTHER): Payer: Self-pay | Admitting: Family Medicine

## 2017-01-16 ENCOUNTER — Encounter (HOSPITAL_BASED_OUTPATIENT_CLINIC_OR_DEPARTMENT_OTHER): Payer: Self-pay

## 2017-01-16 ENCOUNTER — Ambulatory Visit (HOSPITAL_BASED_OUTPATIENT_CLINIC_OR_DEPARTMENT_OTHER): Payer: Self-pay | Admitting: Registered Nurse

## 2017-01-16 ENCOUNTER — Emergency Department (HOSPITAL_BASED_OUTPATIENT_CLINIC_OR_DEPARTMENT_OTHER)
Admission: RE | Admit: 2017-01-16 | Disposition: A | Discharge status: Home / Self Care | Payer: Self-pay | Source: Emergency Department | Attending: Emergency Medicine | Admitting: Emergency Medicine

## 2017-01-16 LAB — BASIC METABOLIC PANEL
ANION GAP: 6 mmol/L (ref 5–15)
BUN (UREA NITROGEN): 13 mg/dL (ref 7–18)
CALCIUM: 9.2 mg/dL (ref 8.5–10.1)
CARBON DIOXIDE: 30 mmol/L (ref 21–32)
CHLORIDE: 104 mmol/L (ref 98–107)
CREATININE: 0.6 mg/dL (ref 0.4–1.2)
ESTIMATED GLOMERULAR FILT RATE: >60 mL/min (ref >60)
Glucose Random: 99 mg/dL (ref 74–160)
POTASSIUM: 4.1 mmol/L (ref 3.5–5.1)
SODIUM: 140 mmol/L (ref 136–145)

## 2017-01-16 LAB — D-DIMER PE/DVT, QUANTITATIVE: D-DIMER PE/DVT, QUANTITATIVE: <215 ng/mLFEU (ref 0.00–499)

## 2017-01-16 LAB — CBC, PLATELET & DIFFERENTIAL
ABSOLUTE BASO COUNT: 0 10*3/uL (ref 0.0–0.1)
ABSOLUTE EOSINOPHIL COUNT: 0.1 10*3/uL (ref 0.0–0.8)
ABSOLUTE IMM GRAN COUNT: 0.02 10*3/uL (ref 0.00–0.03)
ABSOLUTE LYMPH COUNT: 2.3 10*3/uL (ref 0.6–5.9)
ABSOLUTE MONO COUNT: 0.7 10*3/uL (ref 0.2–1.4)
ABSOLUTE NEUTROPHIL COUNT: 3.4 10*3/uL (ref 1.6–8.3)
BASOPHIL %: 0.3 % (ref 0.0–1.2)
EOSINOPHIL %: 1.5 % (ref 0.0–7.0)
HEMATOCRIT: 39.2 % (ref 34.1–44.9)
HEMOGLOBIN: 13.7 g/dL (ref 11.2–15.7)
IMMATURE GRANULOCYTE %: 0.3 % (ref 0.0–0.4)
LYMPHOCYTE %: 36 % (ref 15.0–54.0)
MEAN CORP HGB CONC: 34.9 g/dL (ref 31.0–37.0)
MEAN CORPUSCULAR HGB: 30 pg (ref 26.0–34.0)
MEAN CORPUSCULAR VOL: 86 fL (ref 80.0–100.0)
MEAN PLATELET VOLUME: 11.8 fL (ref 8.7–12.5)
MONOCYTE %: 10.2 % (ref 4.0–13.0)
NEUTROPHIL %: 51.7 % (ref 40.0–75.0)
PLATELET COUNT: 300 10*3/uL (ref 150–400)
RBC DISTRIBUTION WIDTH STD DEV: 38.5 fL (ref 35.1–46.3)
RBC DISTRIBUTION WIDTH: 12.3 % (ref 11.5–14.3)
RED BLOOD CELL COUNT: 4.56 M/uL (ref 3.90–5.20)
WHITE BLOOD CELL COUNT: 6.5 10*3/uL (ref 4.0–11.0)

## 2017-01-16 LAB — CTA CHEST W &/OR WO CONTRAST

## 2017-01-16 LAB — XR CHEST 2 VIEWS

## 2017-01-16 LAB — CTA ABDOMEN W &/OR WO IV CONTRAST

## 2017-01-16 LAB — TROPONIN I: TROPONIN I: <0.02 ng/mL (ref 0.00–0.04)

## 2017-01-16 MED ORDER — METHOCARBAMOL 750 MG PO TABS
750.00 mg | ORAL_TABLET | Freq: Four times a day (QID) | ORAL | 0 refills | Status: AC
Start: 2017-01-16 — End: 2017-01-23

## 2017-01-16 MED ORDER — NORMAL SALINE FLUSH 0.9 % IV SOLN
60.00 mL | Freq: Once | INTRAVENOUS | Status: AC
Start: 2017-01-16 — End: 2017-01-16
  Administered 2017-01-16: 60 mL via INTRA_ARTICULAR

## 2017-01-16 MED ORDER — DIAZEPAM 5 MG PO TABS
5.0000 mg | ORAL_TABLET | Freq: Once | ORAL | Status: DC
Start: 2017-01-16 — End: 2017-01-16
  Filled 2017-01-16: qty 1

## 2017-01-16 MED ORDER — IOHEXOL 350 MG/ML IV SOLN
100.00 mL | Freq: Once | INTRAVENOUS | Status: AC
Start: 2017-01-16 — End: 2017-01-16
  Administered 2017-01-16: 100 mL via BODY_CAVITY

## 2017-01-16 MED ORDER — FAMOTIDINE 20 MG PO TABS
20.0000 mg | ORAL_TABLET | Freq: Once | ORAL | Status: AC
Start: 2017-01-16 — End: 2017-01-16
  Administered 2017-01-16: 20 mg via ORAL
  Filled 2017-01-16: qty 1

## 2017-01-16 MED ORDER — LIDOCAINE 4 % EX CREA: g | Freq: Three times a day (TID) | CUTANEOUS | 0 refills | 0 days | Status: AC | PRN

## 2017-01-16 MED ORDER — METHOCARBAMOL 750 MG PO TABS: 750 mg | tablet | Freq: Four times a day (QID) | ORAL | 0 refills | 0 days | Status: AC

## 2017-01-16 MED ORDER — LIDOCAINE 4 % EX CREA
TOPICAL_CREAM | Freq: Three times a day (TID) | CUTANEOUS | 0 refills | Status: AC | PRN
Start: 2017-01-16 — End: 2017-01-23

## 2017-01-16 MED ORDER — LIDOCAINE VISCOUS 2 % MT SOLN
10.0000 mL | Freq: Once | OROMUCOSAL | Status: AC
Start: 2017-01-16 — End: 2017-01-16
  Administered 2017-01-16: 10 mL via OROMUCOSAL
  Filled 2017-01-16: qty 15

## 2017-01-16 MED ORDER — ALUMINUM & MAGNESIUM HYDROXIDE 200-200 MG/5ML PO SUSP
30.0000 mL | Freq: Once | ORAL | Status: AC
Start: 2017-01-16 — End: 2017-01-16
  Administered 2017-01-16: 30 mL via ORAL
  Filled 2017-01-16: qty 30

## 2017-01-16 NOTE — Narrator Note (Signed)
Patient Disposition    Cleared to d/c home.  Scripts and instructions given and explained.  Will f/u with PCP in the am.    Patient education for diagnosis, medications, activity, diet and follow-up.  Patient left ED 7:40 PM.  Patient rep received written instructions.  Interpreter to provide instructions: Yes    Patient belongings with patient: YES    Have all existing LDAs been addressed? Yes    Have all IV infusions been stopped? N/A    Discharged to: Discharged to home

## 2017-01-16 NOTE — ED Triage Note (Signed)
Intermittent left side CP "ripping", lasts 2-3 " radiates to back - throbbing  Subjective fever + Nausea  Last Ibuprofen last night

## 2017-01-16 NOTE — ED Notes (Signed)
Bed: 07  Expected date:   Expected time:   Means of arrival:   Comments:  Closed for maintainence

## 2017-01-16 NOTE — ED Patient Condition Note (Signed)
Patient condition: Stable

## 2017-01-16 NOTE — Narrator Note (Signed)
Pt remains alert,NAD.She is requesting food now.

## 2017-01-16 NOTE — Narrator Note (Signed)
Dr Jay SchlichterElmer in talking with Pt who is very apprehensive about being discharged.

## 2017-01-16 NOTE — Discharge Instructions (Signed)
DORES NO PEITO                                         Chest Pain-PORTUGUESE                                                           Você foi examinado pelo desconforto no peito.  Embora, estejam a permitir que vá para casa, por favor siga as seguintes instruccões que abaixo indicamos:    *Hoje descanse em casa.  Tome o seu medicamento, conforme foi receitado.    Volte a [facility]:    1.  Se começar a ter dores, afição ou pressão no peito e durarem mais de vários minutos.    2.  Se notou que a sua Angina e o seu desconforto no peito piorou, se dura mais tempo, vem com mais força ou não se alivia com a quantidade que toma de costume, de Nitroglycerin.    3.  Se começar a ter falta de ar, suores, vômitos ou náusea com o seu desconforto no seu peito.    4.  Se o desconforto parece estar a passar para os seus braços, pescoço, costas, queixo ou barriga e está a durar mais tempo ou muda de feitio.    Mesmo que se sinta melhor e não tem mais desconforto, deve ver os seu médico privado no dia seguinte.    Se foi feito um EKG (eletrocardiograma) ou um raio-x, o médico da emergência deve ter feito uma leitura preliminar.  A leitura oficial será feita mais tarte.           01/26/93

## 2017-01-16 NOTE — Narrator Note (Signed)
During triage, pt gasped and grabbed her chest, and bent forward. Lasted approx 2 "

## 2017-01-16 NOTE — ED Notes (Signed)
[id number]

## 2017-01-16 NOTE — Telephone Encounter (Signed)
After receiving page from warm handoff pager called pt with [place] interpreter      Note from provider 01/13/17  " Chest pain x2 months  -sharp, deep, brief pains that starts under L breast  -sometimes goes to back  -feels that it is getting worse--more severe and more frequent  -does not hurt with movement  -does hurt sometimes with deep breathing  -all of this preceded the flu"      Pt reports +fever since this Am and has not had fever since first day of flu.  Pain slightly worse this AM,rating a 10 on pain scale,same pain whether changes positioning or not.  Left sided chest pain radiating  Towards left  back  Denies  HA,denies SOB,denies dizziness,denies vision changes,denies jaw pain,denies nose bleeds,denies swelling in extremities.    Pt still has gallbladder and diet consists of vegetables and  Fruits and a vegetable like powder that is high in carbohydrates.    Last BM this morning mushy/light green/no blood. Pt reports usually having constipation difficulties.    Instructed pt that she needs to go to ED but pt refused. No appts today. Next day appt given with instruction given to pt that if S&S worsen or become concerning to go to ER. Pt agrees with plan.

## 2017-01-16 NOTE — Narrator Note (Signed)
Pt remains alert visiting with her friend.VSS.Pt reports, I still have some chest pain."She has no C/O nausea or any diaphoresis.Dr Jay SchlichterElmer aware of Pt's status.

## 2017-01-16 NOTE — Telephone Encounter (Signed)
Regarding: Chest pain - Same day appointment request  ----- Message from [facility] Fondroit sent at 01/16/2017 10:21 AM EST -----  Darrick PennaEcilmaria Folsom [phone], 46 year old, female    Calls today:  Clinical Questions (NON-SICK CLINICAL QUESTIONS ONLY)      Specific nature of request patient complains of chest pain, spouse is requesting to schedule same day appointment no availability Thank you   Return phone number [phone]  Person calling on behalf of patient: Spouse    Patient's language of care: [place]    Patient does not need an interpreter.    Patient's PCP: Zack SealMeera Sunder, MD

## 2017-01-16 NOTE — ED Provider Notes (Signed)
Review of Patient's Allergies indicates:  No Known Allergies    History   Patient presents with:  Chest Pain: CHEST PAIN WORSE TODAY      Patient is 46 year old female with no significant past medical history presents with intermittent chest pain for 1 month.  Patient seen recently, diagnosed with influenza B.  Patient reports symptoms have not improved, trying ibuprofen and Tylenol, last dose last night.  Patient reports intermittent squeezing pain in her left chest, radiating to back.  Worse with certain movements, positions.  Patient denies any cough, leg swelling, prior PE/DVT, recent travel, rash, dizziness, syncope, tobacco use.         Past Medical History:  No date: Headache disorder    Prior to Admission medications :  Medication ibuprofen (ADVIL,MOTRIN) 600 MG tablet, Sig Take 1 tablet by mouth every 6 (six) hours as needed for Pain Take with food!, Start Date 01/10/17, End Date 02/09/17, Taking? , Authorizing Provider Melisa W Delsa Grana    Medication acetaminophen (TYLENOL) 325 MG tablet, Sig Take 3 tablets by mouth every 6 (six) hours as needed for Pain, Start Date 01/10/17, End Date 01/20/17, Taking? , Authorizing Provider Melisa W Delsa Grana    Medication benzonatate (TESSALON PERLES) 100 MG capsule, Sig Take 1-2 capsules by mouth every 8 (eight) hours as needed for Cough for cough, Start Date 01/10/17, End Date 01/15/17, Taking? , Authorizing Provider Melisa Providence Crosby        Past Surgical History:  No date: CESAREAN SECTION, LOW TRANSVERSE  No date: SALPINGECTOMY COMPLETE/PARTIAL UNI/BI SPX      Comment: HO PEC    History reviewed. No pertinent family history.    Smoking status: Never Smoker                                                              Smokeless tobacco: Never Used                      Alcohol use: No                Review of Systems   Constitutional: Negative for chills and fever.   HENT: Negative for rhinorrhea and sore throat.    Eyes: Negative for photophobia.   Respiratory:  Positive for shortness of breath. Negative for cough.    Cardiovascular: Positive for chest pain.   Gastrointestinal: Negative for abdominal pain, diarrhea, nausea and vomiting.   Genitourinary: Negative for dysuria.   Musculoskeletal: Negative for back pain.   Skin: Negative for rash.   Neurological: Negative for headaches.       Physical Exam   BP 133/79  Pulse 78  Temp 99.2 F  Resp 16  LMP 01/06/2017 (Exact Date)  SpO2 98%    Physical Exam   Constitutional: She is oriented to person, place, and time. She appears well-developed and well-nourished. No distress.   Intermittently clutching left chest   HENT:   Head: Normocephalic.   Right Ear: External ear normal.   Left Ear: External ear normal.   Mouth/Throat: Oropharynx is clear and moist. No oropharyngeal exudate.   Eyes: Conjunctivae and EOM are normal. Pupils are equal, round, and reactive to light.   Neck: Normal range of motion. Neck supple.   Cardiovascular: Normal  rate, regular rhythm, normal heart sounds and intact distal pulses.  Exam reveals no gallop and no friction rub.    No murmur heard.  Pulmonary/Chest: Effort normal and breath sounds normal. No respiratory distress. She has no wheezes. She exhibits no tenderness.   Abdominal: Soft. Bowel sounds are normal. She exhibits no distension. There is no tenderness.   Musculoskeletal: Normal range of motion. She exhibits no edema.   Neurological: She is alert and oriented to person, place, and time. No cranial nerve deficit.   Skin: Skin is warm and dry. She is not diaphoretic.   Nursing note and vitals reviewed.      ED Course   Procedures    MDM    A/P: Patient is 46 year old female with past medical history as above presents with intermittent chest pain for 1 month, unchanged, occasionally clutching left chest.  Seen in the ED 1 week ago, x-ray unremarkable, EKG unremarkable.  Patient diagnosed with influenza at that time.  Patient reports cough is improved, however, chest pain remains.  By history,  positional, worse with certain movements and motions,  Not reproducible on exam.  Patient denies history of rash, no history of shingles in the area.  Labs sent to evaluate for possible anemia, infection, electrolyte disturbance, ACS.  Chest x-ray unremarkable.  Labs negative for d-dimer, do not suspect PE at this time.  Patient with chest pain rating to back, considerations for dissection, however, duration and negative d-dimer make this case extremely well however, patient with several presentations, concern for dangerous occult pathology.  CTA abdomen/pelvis showing no evidence of dissection, no other significant abnormalities.  Patient with unclear etiology for spasmodic chest pain at this time, considerations for nutcracker esophagus, neuralgia, MSK spasms.  Patient given prescription for Robaxin, topical Lidocaine for possible symptomatically relief.  Patient with follow-up with PMD tomorrow, further discussion on other treatments and workups as outpatient.  Considerations of possible outpatient endoscopy or swallow study, or trial of Lyrica/gabapentin.    Prior records reviewed.    Cindie LarocheMark Jarryd Gratz  Attending Physician  Emergency Department  [facility]  [phone]

## 2017-01-17 ENCOUNTER — Encounter (HOSPITAL_BASED_OUTPATIENT_CLINIC_OR_DEPARTMENT_OTHER): Payer: Self-pay | Admitting: Medical

## 2017-01-17 ENCOUNTER — Ambulatory Visit (HOSPITAL_BASED_OUTPATIENT_CLINIC_OR_DEPARTMENT_OTHER): Payer: Medicaid Other | Admitting: Medical

## 2017-01-17 VITALS — BP 102/70 | HR 74 | Temp 97.1°F | Wt 172.6 lb

## 2017-01-17 DIAGNOSIS — R079 Chest pain, unspecified: Secondary | ICD-10-CM

## 2017-01-17 DIAGNOSIS — J101 Influenza due to other identified influenza virus with other respiratory manifestations: Secondary | ICD-10-CM

## 2017-01-17 DIAGNOSIS — M549 Dorsalgia, unspecified: Principal | ICD-10-CM

## 2017-01-17 DIAGNOSIS — M25512 Pain in left shoulder: Secondary | ICD-10-CM

## 2017-01-17 LAB — EKG

## 2017-01-17 MED ORDER — OMEPRAZOLE 20 MG PO CPDR
20.00 mg | DELAYED_RELEASE_CAPSULE | Freq: Every day | ORAL | 0 refills | Status: AC
Start: 2017-01-17 — End: 2017-01-31

## 2017-01-17 MED ORDER — NAPROXEN 500 MG PO TABS: 500 mg | tablet | Freq: Two times a day (BID) | ORAL | 0 refills | 0 days | Status: DC

## 2017-01-17 MED ORDER — NAPROXEN 500 MG PO TABS
500.0000 mg | ORAL_TABLET | Freq: Two times a day (BID) | ORAL | 0 refills | Status: DC
Start: 2017-01-17 — End: 2017-11-24

## 2017-01-17 MED ORDER — OMEPRAZOLE 20 MG PO CPDR: 20 mg | capsule | Freq: Every day | ORAL | 0 refills | 0 days | Status: AC

## 2017-01-17 MED FILL — *OMEPRAZOLE   20MG: 14 days supply | Qty: 14 | Fill #0 | Status: CP

## 2017-01-17 MED FILL — NAPROXEN 500MG: 14 days supply | Qty: 28 | Fill #0 | Status: CP

## 2017-01-17 NOTE — Patient Instructions (Signed)
Patient Education   Patient Education   Index      Index

## 2017-01-17 NOTE — Progress Notes (Signed)
SUBJECTIVE  Christy Palmer is a 46 year old female with:  Patient Active Problem List:     Anxiety state     Headache disorder      Christy Palmer's main goal for today's visit is to address follow-up for ED.     #) F/u ED  - Seen in the ED last week  - Diagnosed with influenza  - Flu symptoms have improved  - Continues with chest pain that radiates to the back  - This pain was present before she was diagnosed with the flu  - Has been worse since the flu  - Having pain daily  - Almost constant  - Clutches her chest when she has the pain  - Pain feels 'inside' the area of left breast and radiates to the back  - Work up in the Ed including EKG, D-Dimer and CTA were negative  - Worse: rapid movements, walking around, talking a lot  - No burning sensation  - No recent fall/trauma  - Occasional numbness/tingling on left flank and arm  - Has been using ibuprofen and tylenol helps only a little bit  - Has not tried lidocaine cream or muscle relaxer    OBJECTIVE    BP 102/70 (Site: LA, Position: Sitting, Cuff Size: Reg)  Pulse 74  Temp 97.1 F (36.2 C) (Temporal)  Wt 78.3 kg (172 lb 9.6 oz)  LMP 01/06/2017 (Exact Date)  SpO2 99%  BMI 30.32 kg/m2  Pain Score: 8 (8/10)      Most Recent BP Reading(s)  01/17/17 : 102/70  01/16/17 : 123/87  01/13/17 : 130/80    Most Recent Weight Reading(s)  01/17/17 : 78.3 kg (172 lb 9.6 oz)  01/13/17 : 80.2 kg (176 lb 12.8 oz)  01/10/17 : 77.1 kg (170 lb)      Physical Exam  General Appearance: NAD. Clutches chest when pain comes on.  Sitting comfortably in exam room.  Alert and oriented to examiner.  Head/Eyes/Ears/Nose/Throat:  Normocephalic/atraumatic.    Chest: + tenderness over left lower anterior chest  Lung: Clear to auscultation bilaterally, no crackles/wheezes/rhonchi  Cardiovascular: Regular rate and rhythm, no murmurs/rubs/gallops   MSK: Shoulder and back: + tenderness over upper left trapezius and over AC joint, abduction limited 2/2 pain over use FROM,  Negative Empty Beer Can Test      ASSESSMENT AND PLAN:  Christy Palmer is a 46 year old female with:    (M54.9) Upper back pain  (primary encounter diagnosis)  (M25.512) Left shoulder pain, unspecified chronicity  (R07.9) Chest pain, unspecified type  (J10.1) Influenza B  Comment: Diagnosed with the flu last week - slowly recovering. Continues with chest pain that radiates to the back and shoulder. Went to ED yesterday - work up negative for ACS, PE or dissection. Given reproducible nature of pain and negative work up - more likely MSK strain that was exacerbated by influenza. Nerve entrapment is also a consideration. Encouraged to start muscle relaxer and lidocaine cream and will trial Naproxen and PT.   Plan:   - naproxen (NAPROSYN) 500 MG tablet,   - omeprazole (PRILOSEC) 20 MG capsule,   - REFERRAL TO PHYSICAL THERAPY ( INT)  - Start muscle relaxer and lidocaine cream  - F/u next week, consider injection for nerve entrapment if sx have worsened or failed to improve            1. The patient indicates understanding of these issues and agrees with the plan.  2.  The patient  is given an After Visit Summary sheet that lists all of their medications with directions, their allergies, orders placed during this encounter, immunization dates, and follow- up instructions.  3. I reviewed the patient's medical information and medical history   4. The patient expressed understanding and no barriers to adherence were identified.  5.  I have reviewed the past medical, family, and social history sections including the medications and allergies listed in the above medical record      Christy Palmer S. Christy Gero, PA-C, 01/17/2017, 11:22 AM    I have spent 25 minutes in face to face time with this patient/patient proxy of which > 50% was in counseling or coordination of care regarding above issues/Dx.

## 2017-01-20 ENCOUNTER — Ambulatory Visit (HOSPITAL_BASED_OUTPATIENT_CLINIC_OR_DEPARTMENT_OTHER): Payer: Medicaid Other | Admitting: Family Medicine

## 2017-01-20 ENCOUNTER — Telehealth (HOSPITAL_BASED_OUTPATIENT_CLINIC_OR_DEPARTMENT_OTHER): Payer: Self-pay | Admitting: Family Medicine

## 2017-01-20 NOTE — Progress Notes (Signed)
Gail from [facility] called the Central Refill Department to complete a benefit analysis for the TDAP Vaccine.    The vaccine is covered under the patient’s prescription coverage.    Please choose 90715.2 TDAP (Prior Auth/Pharmacy)

## 2017-01-20 NOTE — Telephone Encounter (Signed)
-----   Message from Shon HaleGail Cardelle, LPN sent at 1/61/09602/26/2018 12:59 PM EST -----  Pt. Being seen in clinic, needs prior authorization for TDAP please. Shon HaleGail cardelle, LPN

## 2017-02-24 ENCOUNTER — Ambulatory Visit: Payer: Self-pay | Admitting: Family Medicine

## 2017-02-24 DIAGNOSIS — Z1239 Encounter for other screening for malignant neoplasm of breast: Principal | ICD-10-CM

## 2017-02-24 NOTE — Addendum Note (Signed)
Addended byCelene Kras on: 02/24/2017 09:34 AM     Modules accepted: Orders

## 2017-02-26 LAB — MA SCREENING MAMMO BILATERAL DIGITAL WITH DBT & CAD

## 2017-02-27 LAB — EKG

## 2017-03-04 ENCOUNTER — Encounter (HOSPITAL_BASED_OUTPATIENT_CLINIC_OR_DEPARTMENT_OTHER): Payer: Self-pay | Admitting: Family Medicine

## 2017-04-02 ENCOUNTER — Encounter (HOSPITAL_BASED_OUTPATIENT_CLINIC_OR_DEPARTMENT_OTHER): Payer: Self-pay | Admitting: Registered Nurse

## 2017-04-02 ENCOUNTER — Emergency Department (HOSPITAL_BASED_OUTPATIENT_CLINIC_OR_DEPARTMENT_OTHER)
Admission: RE | Admit: 2017-04-02 | Disposition: A | Discharge status: Home / Self Care | Payer: Self-pay | Source: Emergency Department | Attending: Emergency Medicine | Admitting: Emergency Medicine

## 2017-04-02 LAB — BASIC METABOLIC PANEL
ANION GAP: 10 mmol/L (ref 5–15)
BUN (UREA NITROGEN): 17 mg/dL (ref 7–18)
CALCIUM: 9.3 mg/dL (ref 8.5–10.1)
CARBON DIOXIDE: 28 mmol/L (ref 21–32)
CHLORIDE: 101 mmol/L (ref 98–107)
CREATININE: 0.7 mg/dL (ref 0.4–1.2)
ESTIMATED GLOMERULAR FILT RATE: >60 mL/min (ref >60)
Glucose Random: 112 mg/dL (ref 74–160)
POTASSIUM: 4.1 mmol/L (ref 3.5–5.1)
SODIUM: 139 mmol/L (ref 136–145)

## 2017-04-02 LAB — URINALYSIS RFLX TO URINE CULT
BILIRUBIN, URINE: NEGATIVE
CASTS: NONE SEEN PER LPF
CRYSTALS: NONE SEEN
GLUCOSE, URINE: NEGATIVE MG/DL
KETONE, URINE: NEGATIVE MG/DL
LEUKOCYTE ESTERASE: NEGATIVE
NITRITE, URINE: NEGATIVE
PH URINE: 5.5 (ref 5.0–8.0)
PROTEIN, URINE: NEGATIVE MG/DL
SPECIFIC GRAVITY URINE: 1.025 (ref 1.003–1.035)
SQUAMOUS EPITHELIAL CELLS: >10 PER LPF — AB (ref 0–4)

## 2017-04-02 LAB — CBC, PLATELET & DIFFERENTIAL
ABSOLUTE BASO COUNT: 0.1 10*3/uL (ref 0.0–0.1)
ABSOLUTE EOSINOPHIL COUNT: 0.2 10*3/uL (ref 0.0–0.8)
ABSOLUTE IMM GRAN COUNT: 0.02 10*3/uL (ref 0.00–0.03)
ABSOLUTE LYMPH COUNT: 2.8 10*3/uL (ref 0.6–5.9)
ABSOLUTE MONO COUNT: 0.7 10*3/uL (ref 0.2–1.4)
ABSOLUTE NEUTROPHIL COUNT: 6.1 10*3/uL (ref 1.6–8.3)
BASOPHIL %: 0.5 % (ref 0.0–1.2)
EOSINOPHIL %: 1.9 % (ref 0.0–7.0)
HEMATOCRIT: 38.6 % (ref 34.1–44.9)
HEMOGLOBIN: 13.8 g/dL (ref 11.2–15.7)
IMMATURE GRANULOCYTE %: 0.2 % (ref 0.0–0.4)
LYMPHOCYTE %: 28.3 % (ref 15.0–54.0)
MEAN CORP HGB CONC: 35.8 g/dL (ref 31.0–37.0)
MEAN CORPUSCULAR HGB: 30.6 pg (ref 26.0–34.0)
MEAN CORPUSCULAR VOL: 85.6 fL (ref 80.0–100.0)
MEAN PLATELET VOLUME: 11.7 fL (ref 8.7–12.5)
MONOCYTE %: 7.3 % (ref 4.0–13.0)
NEUTROPHIL %: 61.8 % (ref 40.0–75.0)
PLATELET COUNT: 319 10*3/uL (ref 150–400)
RBC DISTRIBUTION WIDTH STD DEV: 38.8 fL (ref 35.1–46.3)
RBC DISTRIBUTION WIDTH: 12.7 % (ref 11.5–14.3)
RED BLOOD CELL COUNT: 4.51 M/uL (ref 3.90–5.20)
WHITE BLOOD CELL COUNT: 9.8 10*3/uL (ref 4.0–11.0)

## 2017-04-02 LAB — US RENAL

## 2017-04-02 LAB — POC URINALYSIS
BILIRUBIN, URINE: NEGATIVE
GLUCOSE,URINE: NEGATIVE
KETONE, URINE: NEGATIVE
LEUKOCYTE ESTERASE: NEGATIVE
NITRITE, URINE: NEGATIVE
PH URINE: 5.5 (ref 5.0–8.0)
PROTEIN, URINE: NEGATIVE
SPECIFIC GRAVITY, URINE: 1.025 (ref 1.003–1.030)
UROBILINOGEN URINE: 0.2 (ref 0.2–1.0)

## 2017-04-02 LAB — URINE PREGNANCY TEST (POINT OF CARE): HCG QUALITATIVE URINE: NEGATIVE

## 2017-04-02 MED ORDER — IBUPROFEN 600 MG PO TABS
600.0000 mg | ORAL_TABLET | Freq: Once | ORAL | Status: DC
Start: 2017-04-02 — End: 2017-04-02

## 2017-04-02 MED ORDER — LIDOCAINE 5 % EX PTCH
1.0000 | MEDICATED_PATCH | Freq: Once | CUTANEOUS | Status: DC
Start: 2017-04-02 — End: 2017-04-02
  Administered 2017-04-02: 1 via TOPICAL
  Filled 2017-04-02: qty 1

## 2017-04-02 MED ORDER — KETOROLAC TROMETHAMINE 30 MG/ML INJ
30.0000 mg | Freq: Once | Status: AC
Start: 2017-04-02 — End: 2017-04-02
  Administered 2017-04-02: 30 mg via INTRAVENOUS
  Filled 2017-04-02: qty 1

## 2017-04-02 MED ORDER — SODIUM CHLORIDE 0.9 % IV BOLUS
1000.0000 mL | Freq: Once | INTRAVENOUS | Status: AC
Start: 2017-04-02 — End: 2017-04-02
  Administered 2017-04-02: 1000 mL via INTRAVENOUS

## 2017-04-02 NOTE — ED Provider Notes (Signed)
I have reviewed the ED nursing notes and prior records. I have reviewed the patient's past medical history/problem list, allergies, social history and medication list.  I saw this patient primarily.    HPI:  This 46 year old female patient presents with chief complaint of right-sided low back pain that has been going on for the past 3 weeks with associated subjective fever and chills.  Patient has not measured any fevers at home.  Patient last took Tylenol sometime today.  Otherwise reports she has been using Tylenol ibuprofen for pain control.  States that she was previously seen by her primary care doctor for upper back pain and over the past 3 weeks it is been a lower back.  States the pain comes around the right flank.  She is not having any nausea or vomiting.  No abdominal pain otherwise.  No history of kidney stones.  She does report that her urine is darker than usual but is not having any frank dysuria.  She is not having any bowel or bladder incontinence.  No perineal paresthesias.  No lower extremity weakness.    ROS: Pertinent positives were reviewed as per the HPI above. All other systems were reviewed and are negative.    Past Medical History/Problem List:  Past Medical History:  No date: Headache disorder  Patient Active Problem List:     Anxiety state     Headache disorder      Past Surgical History:  Past Surgical History:  No date: CESAREAN SECTION, LOW TRANSVERSE  No date: SALPINGECTOMY COMPLETE/PARTIAL UNI/BI SPX      Comment: HO PEC    Medications:     Current Facility-Administered Medications:  ibuprofen (ADVIL,MOTRIN) tablet 600 mg 600 mg Oral Once [employer] J Nastacia Raybuck     Current Outpatient Prescriptions:  naproxen (NAPROSYN) 500 MG tablet Take 1 tablet by mouth 2 (two) times daily with meals Disp: 28 tablet Rfl: 0       Social History:     Smoking status: Never Smoker    Smokeless tobacco: Never Used    Alcohol use No       Allergies:  Review of Patient's Allergies indicates:  No Known  Allergies    Physical Exam:  BP 125/79  Pulse 81  Temp 98.5 F  Resp 20  Wt 78 kg (171 lb 15.3 oz)  SpO2 97%  BMI 30.2 kg/m2  GENERAL:  Well-appearing, no distress.  SKIN:  Warm & Dry, no rash, no bruising.  HEAD:  Atraumatic. PERRL. Anicteric sclera. Oropharynx clear.  NECK:  Supple with full painless ROM at the neck  LUNGS:  Clear to auscultation bilaterally without rales, rhonchi or wheezing.   HEART:  RRR.  No murmurs, rubs, or gallops.   ABDOMEN:  Soft, flat, without distension.  Right CVA tenderness to palpation with some mild right flank tenderness.  No focal abdominal tenderness or guarding.  MUSCULOSKELETAL:  No deformities. Well-perfused extremities with  2+ DP pulses bilaterally. No cyanosis or edema.  NEUROLOGIC:  Normal speech.  alert & oriented x 3, CNsII-XII grossly intact. Gait normal.  PSYCHIATRIC:  Normal affect    DIAGNOSTICS:   No results found for this visit on 04/02/17 (from the past 24 hour(s)).    Renal ultrasound: Pending    ED Course and Medical Decision-making:    The patient is a 46 year old female with right low back pain that radiates around to the right flank has been going on for the past 3 weeks.  The patient does not report any history of kidney stones and she is afebrile on arrival but she does report some subjective fever and chills at home that her occurring daily for the past 3 weeks.  She took Tylenol earlier today but no NSAIDs otherwise.  She does have some CVA tenderness with tenderness in the right flank.  Concern for possible kidney stone versus pyelonephritis.  Also could be muscle strain as patient has had upper back muscle strain in the past.  No signs or symptoms to suggest cauda equina.    Plan for basic labs including CBC, electrolytes and urinalysis.  Will also obtain renal ultrasound looking for signs of urinary obstruction.    Patient pending labs and ultrasound at the time of signout to Dr. Venetia Maxon at 1500.    VSS BP 125/79  Pulse 81  Temp 98.5 F  Resp 20  Wt  78 kg (171 lb 15.3 oz)  SpO2 97%  BMI 30.2 kg/m2    Disposition: Pending    Diagnosis/Diagnoses:  Acute right-sided low back pain without sciatica  Right flank pain    Netta Corrigan, MD  Emergency Medicine Attending Physician  [facility]

## 2017-04-02 NOTE — ED Notes (Signed)
Addendum: I received sign-out on this patient at 3p from Dr. Andi HenceMaselli with a plan to f/u on renal US for flank pain.  I have reviewed the available records and have interviewed and examined the patient myself.  She says she feels better but is concerned the pain will return.  Lidoderm patch placed to help.  US with renal cyst but no acute findings.  Pt made aware of cyst and need for f/u imaging, also to f/u w/ pcp re: sxs and hematuria.  She agrees.    Quincy Sheehananielle A. Landis Dowdy, MD

## 2017-04-02 NOTE — Narrator Note (Addendum)
Urine obtained and tested. Labs with line and IVF.  Pt medicated for pain.  Transported to Radiology for US. Husband at bedside.

## 2017-04-02 NOTE — Narrator Note (Signed)
Patient Disposition  Re-eval by Dr. Venetia MaxonSilverman  Important to F/U with PCP for continued care including repeat U/A and US.  Return to ED at any time fro any problems.    Patient education for diagnosis, medications, activity, diet and follow-up.  Patient left ED 4:25 PM.  Patient rep received written instructions.  Interpreter to provide instructions: No. family interpreting per patient request  amb with steady gait at discharge.       Patient belongings with patient: YES    Have all existing LDAs been addressed? Yes    Have all IV infusions been stopped? Yes    Discharged to: Discharged to home

## 2017-04-02 NOTE — ED Triage Note (Signed)
Amb with steady gait to room accompanied by her husband. Via [place] interpreter, [phone], patient reports pain started in breast and upper back. Pain is now right lower back and right hip. Pt reports daily subjective fever in the afternoon. "no fever today". Pt had been taking Ibuprofen for pain but stopped 4 days ago with concern for kidney disease. Pt has been taking Tylenol. LD this morning. Denies dysuria, reports urine is darker. Dr. Andi HenceMaselli at bedside for evaluation. Will obtain urine for testing.

## 2017-04-02 NOTE — Discharge Instructions (Signed)
Your lab tests and ultrasound test did not show any emergency problems or cause of your pain.  You probably pulled a muscle in your back or caused some inflammation in a disc in your back.  This should get better on its own but may take several days.  No heavy lifting until you feel completely better.  Please check in with this week to make sure you are getting better.    If you think you are having fevers at home, please USE A THERMOMETER!  Measure your temperature and talk to your doctor about the number.    Your ultrasound test showed a cyst on your right kdiney.  This is not an emergency, but it is very important that you follow up with your doctor about this.  Please call them this week to discuss this result and the blood in your urine.  You will need another ultrasound in a few months to make sure the cyst is not growing.  You also might need more tests for the blood in your urine that we cannot do from the ER.    RETURN TO ER right away for severe or increased pain, fevers, trouble controlling your pee or poop, trouble moving or feeling any part of your body, or for any other concerns or worsening.         Index English Alllanguages Relatedtopics   Dores Lombares   (Low Back Pain)     ________________________________________________________________________  PONTOS PRINCIPAIS   A presena de dores e rigidez na regio lombar  uma condio comum.   A dor lombar pode ser tratada com exerccios, gelo, calor mido e, algumas vezes, com medicamentos ou cirurgia.   Manter os msculos fortes, usar a [place] e aprender a como erguer objetos corretamente pode ajudar a Automotive engineer problemas de hrnia de disco.  ________________________________________________________________________  Fredric Mare so dores lombares?   A presena de dores e rigidez na regio lombar  uma condio comum.  uma das principais razes pelas quais as pessoas faltam ao trabalho.   No centro de sua coluna lombar, existem cinco ossos na  espinha chamados vrtebras lombares. Msculos e ligamentos ajudam a manter as vrtebras na posio adequada. Entre as vrtebras existem amortecedores de choque na forma de gel, [employer]. Os nervos que conduzem  parte inferior do corpo passam Civil Service fast streamer.   Qual  a causa?  Voc pode ter dores se alguma parte de suas costas for lesionada, distendida ou afetada por alguma doena.   As causas mais comuns de dores nas costas incluem:   Audiological scientist ou carregar com frequncia objetos pesados   Passar muito tempo sentado ou em p na mesma posio ou curvado   Estar acima do peso  As causas menos comuns de dores nas costas incluem:   Um disco que se projeta ou  empurrado para fora da posio por uma leso ou uma distenso severa. Um disco deslocado (herniado) pode pinar os nervos que atravessam os ossos, causando dor nas pernas.   Leses causadas por queda, exerccio anormalmente extenuante ou at [employer] crise de tosse ou espirros muito violenta   Inchao e irritao provocados por uma infeco ou um problema no sistema imunolgico   Uma doena congnita (um problema com o qual voc nasceu)   Uma doena degenerativa (um problema que faz com que os ossos, juntas, discos ou msculos se rompam, como a artrite)  Quais so os sintomas?  Os sintomas incluem:   Dor nas costas ou nas pernas  Mickel Duhamel nas pernas   Formigamento ou insensibilidade nas pernas ou nos ps   Rigidez, espasmos ou movimento limitado  A dor pode ser constante ou pode ocorrer somente em determinadas posies. Ela pode piorar quando voc tosse, espirra, curva-se, torce o corpo ou faz fora durante a evacuao. A dor pode estar somente em um lugar ou espalhada para outras reas, mais comumente descendo para as ndegas e para a parte traseira da coxa.  Como  feito o diagnstico?   Seu profissional da rea de sade analisar o seu histrico mdico e o examinar. Pode ser necessrio fazer exame de raio-X de suas costas.  Qual  o  tratamento?   O tratamento para dores lombares depende da causa. O profissional da rea de sade poder recomendar:   Descanso. Sempre  melhor tentar [employer]. Portanto, no fique na cama por mais de um ou dois dias, ou pelo tempo recomendado por seu profissional da rea de sade.   Exerccio. Seu profissional da rea de sade pode recomendar fisioterapia ou exerccios que voc pode fazer em casa.   Medicamentos. Vrios tipos de medicamentos podem ajudar a Musician as Scientist, research (life sciences). Tome todos os medicamentos conforme recomendado por seu profissional da rea de sade.   Cirurgia. Dependendo da causa das dores nas costas e da persistncia dos sintomas, [place] voc precise ser submetido a [place]. Entretanto, as causas mais comuns de dores nas costas no precisam de [place].  Que cuidados devo ter comigo mesmo?   Para ajudar a Paramedic a dor:   Aplique uma compressa de gelo, bolsa de gelo ou um pacote de alimento congelado envolvido em um pano  rea dolorida durante 20 minutos a cada 3 ou 4 horas.   Tome um Automatic Data anti-inflamatrio, como ibuprofeno, ou outro medicamento recomendado pelo seu profissional da rea de sade. Medicamentos anti-inflamatrios no esteroides (AINEs), como ibuprofeno, podem causar sangramentos no estmago e outros problemas. Esses riscos aumentam com a idade. Leia o rtulo e siga as indicaes de uso. A menos que isso seja recomendado por seu profissional da rea de sade, no tome esses medicamentos por mais de 10 dias.   Coloque uma bolsa de gua quente ou uma almofada de aquecimento eltrica em suas costas. [place] a bolsa de gua quente com uma toalha ou use a almofada eltrica em temperatura baixa para no queimar sua pele.   Tente aplicar calor mido sobre a rea lesionada por 10 a 15 minutos de cada vez antes de fazer exerccios de aquecimento e alongamento. O calor mido pode ajudar a relaxar os msculos, facilitando o movimento do corpo. O calor mido inclui  bandagens quentes ou almofadas de aquecimento mido que podem ser adquiridas na [place], um pano ou toalha mida que tenha sido aquecido na secadora ou um banho quente.   No aplique calor se a rea estiver inchada.    Faa uma sesso de massagem nas costas com um profissional qualificado.   Fale com um conselheiro se suas dores nas costas estiverem relacionadas  tenso provocada por problemas emocionais.  A dor  a melhor Psychologist, educational de Community education officer o ritmo de aumento da atividade e do exerccio que Publishing copy. Desconfortos de pouca importncia, inflexibilidade, sensibilidade e dores brandas no precisam limitar sua atividade.   Pergunte ao seu profissional da rea de sade:   Como e quando voc ser informado sobre os resultados de seus exames   Quanto tempo voc levar para se recuperar dessa condio   Se h alguma atividade  que voc deve evitar e quando pode voltar s suas atividades AMR Corporationnormais   Como se cuidar em casa   [place] so os sintomas ou problemas que voc deve observar e o que fazer se eles aparecerem  Lembre-se de perguntar quando voc deve voltar para fazer um check-up.  Como posso evitar dores lombares?   Seguem aqui algumas coisas que voc pode fazer para expor suas costas a menos tenso:   [place] seus msculos abdominais e das costas fortalecidos. Faa algum exerccio todos os dias, incluindo exerccios de alongamento e aquecimento sugeridos por seu profissional da rea de sade ou fisioterapeuta. Exerccios regulares no apenas ajudam as suas costas. Eles tambm o ajudam a manter-se saudvel de [place]maneira geral.   Pratique a boa postura.  ? Fique de p com a cabea alta, os ombros retos, o peito para a frente, o peso equilibrado CMS Energy Corporationuniformemente em ambos os ps e a plvis dobrada.  ? Sempre que se sentar, sente-se em uma cadeira com encosto reto e apoie sua espinha contra o encosto da cadeira.  ? Use um apoio de ps para um p quando permanecer de p ou sentado no mesmo lugar  por muito tempo. Isso mantm suas costas retas.   Proteja suas costas.  ? Quando Animatorprecisar mover um objeto pesado, no se volte para o objeto e empurre com os braos. Vire-se e use suas costas para empurr-lo, de modo que o esforo seja feito pelas suas pernas.  ? Quando erguer um USG Corporationobjeto pesado, dobre os joelhos e quadris e [place] Valle[place]ha as costas retas. Se voc levantar pesos constantemente, use uma cinta projetada para sustentar suas costas. Evite erguer The St. Paul Travelersobjetos pesados mais alto do que a Greecesua cintura.  ? Carregue pacotes prximos ao corpo, com os braos dobrados.  ? Deite-se de lado com os joelhos dobrados quando Warehouse managerdormir ou descansar. Pode ser til colocar um travesseiro entre seus joelhos. Coloque um travesseiro sob os joelhos quando dormir de Armed forces logistics/support/administrative officercostas. Talvez voc precise evitar dormir de bruos.   Perca peso se voc estiver acima do peso.  Developed by [employer].  Adult Advisor 2017.1 publicado por [employer].  ltima modificao: 2014-01-31  ltima reviso: 2015-04-12  Este contedo  revisado periodicamente e est sujeito a alteraes conforme novas informaes de sade vo sendo disponibilizadas. As informaes tm a finalidade de informar e educar e no substituem a avaliao, a orientao, o diagnstico e o tratamento mdico por um profissional de sade.  Adult Advisor 2017.1 Index    Copyright  2017 [employer], a division of [employer] All rights reserved.

## 2017-04-04 ENCOUNTER — Ambulatory Visit (HOSPITAL_BASED_OUTPATIENT_CLINIC_OR_DEPARTMENT_OTHER): Payer: Medicaid Other | Admitting: Family Medicine

## 2017-04-04 ENCOUNTER — Encounter (HOSPITAL_BASED_OUTPATIENT_CLINIC_OR_DEPARTMENT_OTHER): Payer: Self-pay | Admitting: Family Medicine

## 2017-04-04 VITALS — BP 98/64 | HR 78 | Temp 97.5°F | Wt 175.0 lb

## 2017-04-04 DIAGNOSIS — R109 Unspecified abdominal pain: Principal | ICD-10-CM

## 2017-04-04 LAB — POC URINALYSIS
BILIRUBIN, URINE: NEGATIVE
GLUCOSE,URINE: NEGATIVE
KETONE, URINE: NEGATIVE
LEUKOCYTE ESTERASE: NEGATIVE
NITRITE, URINE: NEGATIVE
OCCULT BLOOD, URINE: NEGATIVE
PH URINE: 7 (ref 5.0–8.0)
PROTEIN, URINE: NEGATIVE
SPECIFIC GRAVITY, URINE: 1.01 (ref 1.003–1.030)
UROBILINOGEN URINE: 0.2 (ref 0.2–1.0)

## 2017-04-04 MED ORDER — TAMSULOSIN HCL 0.4 MG PO CAPS: 0 mg | capsule | Freq: Every day | ORAL | 0 refills | 0 days | Status: AC

## 2017-04-04 MED ORDER — TAMSULOSIN HCL 0.4 MG PO CAPS
0.40 mg | ORAL_CAPSULE | Freq: Every day | ORAL | 0 refills | Status: AC
Start: 2017-04-04 — End: 2017-04-09

## 2017-04-04 MED ORDER — KETOROLAC TROMETHAMINE 30 MG/ML (FOR FAM USE)
30.00 mg | Freq: Once | INTRAMUSCULAR | Status: AC
Start: 2017-04-04 — End: 2017-04-04
  Administered 2017-04-04: 30 mg via INTRAMUSCULAR

## 2017-04-04 MED FILL — TAMSULOSIN 0.4MG: 5 days supply | Qty: 5 | Fill #0 | Status: CP

## 2017-04-04 NOTE — Patient Instructions (Addendum)
For pain and discomfort you may take the following:  Tylenol 500mg --2 pills every 6 hours  Motrin or Ibuprofen 600mg  every 6 hours with food.  These are over the counter medications available at the pharmacy.  You may alternate or take these or for stronger pain relief, take them at the same time.  As your pain subsides, decrease the doses.    If you have a muscular/skeletal injury regular icing decreases pain--20 minutes every 1-2 hours using a reusable ice pack or a package of frozen vegetables.     Patient Education

## 2017-04-04 NOTE — Progress Notes (Signed)
[facility]  Office Visit Note   Patient presents with:  ER F/U    Subjective:   Christy Palmer is a 46 year old female patient who presents today for back pain and urinary symptoms    1. R sided back/flank pain  -was seen in ED 2 days ago  -renal ultrasound unremarkable  -pain started about 3 weeks ago  -describes crampy, dull pain R lower back and R CVA  -ibuprofen helps--taking 600mg   -does not believe it has anything to do with muscles and does not want PT  -no increased pain with movement or change in position  -wonders if pain is related to renal cyst or pyleo  -has had pyleo in the past  -has malaise but no fever, chills, N, V  -has hesitation and some dysuria    She reports that she has never smoked. She has never used smokeless tobacco.    Objective:   BP 98/64  Pulse 78  Temp 97.5 F (36.4 C) (Temporal)  Wt 79.4 kg (175 lb)  LMP 03/30/2017 (Exact Date)  SpO2 99%  BMI 30.74 kg/m2  General: alert, at times uncomfortable appearing  Eyes: Conjunctiva Clear  Cardio: regular rate and rhythm and S1, S2 normal  Pulmonary: clear to auscultation and no wheezing  GI: soft, non-tender. Bowel sounds normal.   Back: some R sided CVA TTP; no spinal Tenderness;  FROM without difficulty  Skin: skin color, texture, turgor are normal, No rashes or lesions   Neuro: Alert and oriented X 3. Strength grossly intact. Normal coordination and gait.    Urine dip: negative    Assessment & Plan:      Diagnosis:  Rt flank pain  (primary encounter diagnosis)     Orders Placed This Encounter      POC Urinalysis      Urine Culture      ketorolac (TORADOL) injection 30 mg      tamsulosin (FLOMAX) 0.4 MG CAPS Capsule       46 yo with 3 weeks of intermittent R sided back/flank pain that she describes as a severe crampy pain, worse after urinating, somewhat relieved with NSAIDS.  Urine dip in ED showed large amount of blood, some bacteria.  Urine culture was not done.  She is having some hesitation and some pain with urination.   Urine dip today completely normal but will send for urine culture.   Pain does not seem to be MSK but unclear what is causing it--perhaps a stone that she passed or pyleo.  Although the renal US was negative (except for small renal cyst), she may have smaller stones that were missed.  Discussed with Dr. Alecia Lemmingeisman and with shared decision making with patient will give Flomax for 5 days to see if symptoms improve.  If there is no improvement may consider CT to r/o stones or nephrology consult.     We discussed flomax and the importance of medication compliance. The patient was ready to learn and no apparent learning barriers were identified. I explained the diagnosis and treatment plan, and the patient expressed understanding of the content. Possible side effects of the prescribed medication(s) were explained, including dizziness, N.  I attempted to answer any questions regarding the diagnosis and the proposed treatment.    1. The patient indicates understanding of these issues and agrees with the plan.  2. The patient is given an After Visit Summary sheet that lists all of their medications with directions, their allergies, orders placed during  this encounter, immunization dates, and follow- up instructions.  3. I reviewed the patient's medical information and medical history   4. The patient expressed understanding and no barriers to adherence were identified.  5. I have reviewed the past medical, family, and social history sections including the medications and allergies listed in the above medical record    Sherril Cong, PA-C  04/04/2017

## 2017-04-04 NOTE — Progress Notes (Signed)
04/04/2017, 12:08 PM    Per orders of Omega A. Bonnell-Bradley, PA, medication Toradol 30 mg administered IM at left deltoid without issue. Patient tolerated injection well. Patient instructed to report any adverse reaction to me immediately. Pt advice to wait 15 minutes before leaving.       Administrations This Visit     ketorolac (TORADOL) injection 30 mg     Admin Date  04/04/2017 Action  Given Dose  30 mg Route  Intramuscular Administered By  Tamela OddiBetsy D. Toren Tucholski

## 2017-04-05 LAB — URINE CULTURE: URINE CULTURE/COLONY COUNT: NO GROWTH

## 2017-09-18 ENCOUNTER — Telehealth (HOSPITAL_BASED_OUTPATIENT_CLINIC_OR_DEPARTMENT_OTHER): Payer: Self-pay

## 2017-09-18 NOTE — Progress Notes (Signed)
[facility]. Pt outreached to inform about the Program and to help scheduling a Pap. Pt agreed to book for 10/06/2017 at 8.40 am.       The patient verbally consented to the following statement:  I understand that [facility] is working with the [place] [employer] [facility](DPH) to offer education and assistance to me in order to get important health screening tests. I authorize [place] to share my enrollment and health history information and information about the screening tests, results and follow up services I received, to see if it helped me and to collect data so that the [facility] and [facility] can obtain more funding to continue to provide services to patients. I also understand that my information is protected by state and federal laws. Only certain people from the [facility] program at the [facility] will see my information. I can also cancel my consent to share information at any time by contacting the clinical program manager, Theone Murdochebra Crowley, RN at [phone]

## 2017-10-06 ENCOUNTER — Ambulatory Visit (HOSPITAL_BASED_OUTPATIENT_CLINIC_OR_DEPARTMENT_OTHER): Payer: Medicaid Other | Admitting: Family Medicine

## 2017-10-27 ENCOUNTER — Telehealth (HOSPITAL_BASED_OUTPATIENT_CLINIC_OR_DEPARTMENT_OTHER): Payer: Self-pay

## 2017-10-27 NOTE — Progress Notes (Signed)
[facility]. Pt outreached to reschedule Pap. Pt agreed to book for 11/24/2017 at 9 am.

## 2017-11-17 ENCOUNTER — Telehealth (HOSPITAL_BASED_OUTPATIENT_CLINIC_OR_DEPARTMENT_OTHER): Payer: Self-pay | Admitting: Family Medicine

## 2017-11-17 NOTE — Progress Notes (Signed)
crystal  from [facility]f  called the [facility] to complete a benefit analysis for the t dap  Vaccine.            The vaccine is covered under the patients prescription coverage.                 Please choose Prior Auth

## 2017-11-17 NOTE — Telephone Encounter (Signed)
-----   Message from Jomarie Longsrystal N Kibe, LPN sent at [id number]/24/2018  8:18 AM EST -----  Regarding: benefit review  Vaccine Benefit Analysis:    Needed for Tdap vaccine(s).  Is patient currently waiting in clinic? No - Appt Date: 12/31    Route to the nursing pool?  Yes

## 2017-11-21 ENCOUNTER — Encounter (HOSPITAL_BASED_OUTPATIENT_CLINIC_OR_DEPARTMENT_OTHER): Payer: Self-pay

## 2017-11-21 NOTE — Progress Notes (Signed)
Care Coordination Program. Writer called and reminded Pt about her appt on Monday for Pap. She stated she will be there.

## 2017-11-24 ENCOUNTER — Ambulatory Visit (HOSPITAL_BASED_OUTPATIENT_CLINIC_OR_DEPARTMENT_OTHER): Payer: Medicaid Other | Admitting: Family Medicine

## 2017-11-24 ENCOUNTER — Encounter (HOSPITAL_BASED_OUTPATIENT_CLINIC_OR_DEPARTMENT_OTHER): Payer: Self-pay | Admitting: Family Medicine

## 2017-11-24 VITALS — BP 120/60 | HR 72 | Temp 97.6°F | Ht 62.99 in | Wt 173.0 lb

## 2017-11-24 DIAGNOSIS — R519 Headache, unspecified: Secondary | ICD-10-CM

## 2017-11-24 DIAGNOSIS — Z124 Encounter for screening for malignant neoplasm of cervix: Secondary | ICD-10-CM

## 2017-11-24 DIAGNOSIS — R51 Headache: Principal | ICD-10-CM

## 2017-11-24 NOTE — Progress Notes (Signed)
[facility]  Office Visit Note   Patient presents with:  Gyn Exam     Subjective:   Christy Palmer is a 46 year old female patient who presents today for PAP smear.  She is with her husband who she wishes to interpret.    1. GYN  -no vaginal or urinary symptoms  2. HA  -overall they are improved  -takes tylenol/nsaids when needed    Objective:   BP 120/60  Pulse 72  Temp 97.6 F (36.4 C) (Temporal)  Ht 5' 2.99" (1.6 m)  Wt 78.5 kg (173 lb)  SpO2 98%  BMI 30.65 kg/m2  Pain Score: Data Unavailable        Most Recent BP Reading(s)  11/24/17 : 120/60  04/04/17 : 98/64  04/02/17 : 125/79      General: alert and no distress. Eyes: EOMI, Conjunctiva Clear. Cardio: regular rate and rhythm and S1, S2 normal. Pulmonary: clear to auscultation and no wheezing. GI: soft, non-tender. Bowel sounds normal. No masses,  no organomegaly  PELVIC:   External Genitalia: normal architecture  Vagina: well rugated and no lesions  Vaginal Discharge: normal appearing  Cervix: no lesions, no friability, no CMT  Ext: no edema    Assessment & Plan:      Diagnosis:  Headache disorder  (primary encounter diagnosis)  Screening for cervical cancer   Orders Placed This Encounter      OBTAINING SCREEN PAP SMEAR      Human Papillomavirus (HPV)            1. The patient indicates understanding of these issues and agrees with the plan.  2. The patient is given an After Visit Summary sheet that lists all of their medications with directions, their allergies, orders placed during this encounter, immunization dates, and follow- up instructions.  3. I reviewed the patient's medical information and medical history   4. The patient expressed understanding and no barriers to adherence were identified.  5. I have reviewed the past medical, family, and social history sections including the medications and allergies listed in the above medical record    Sherril CongOmega Bonnell-Bradley, PA-C  11/24/2017

## 2017-11-26 LAB — HUMAN PAPILLOMAVIRUS (HPV): HUMAN PAPILLOMAVIRUS: NEGATIVE

## 2017-11-27 LAB — CYTOPATH, C/V, THIN LAYER

## 2017-11-28 NOTE — Progress Notes (Signed)
Dear Christy Palmer, Please:     1. Select the language (TongaPortuguese ([place]ian)) appropriate Letter Template.       2. Insert the following comments:           "Just wanted to let you know that your recent PAP smear was normal.  Your next PAP will be due in 5 years.    Have a good day!     Yours truly,     Purnell ShoemakerMeg Bradley PA-C    "     3. Send the letter to the patient.    Thank you,  Meg

## 2018-12-15 ENCOUNTER — Ambulatory Visit (HOSPITAL_BASED_OUTPATIENT_CLINIC_OR_DEPARTMENT_OTHER): Payer: Self-pay

## 2018-12-15 ENCOUNTER — Emergency Department
Admission: EM | Admit: 2018-12-15 | Discharge: 2018-12-15 | Disposition: A | Discharge status: Home / Self Care | Payer: No Typology Code available for payment source | Source: Intra-hospital | Attending: Physician Assistant | Admitting: Physician Assistant

## 2018-12-15 DIAGNOSIS — M545 Low back pain, unspecified: Secondary | ICD-10-CM

## 2018-12-15 MED ORDER — METHOCARBAMOL 750 MG PO TABS: 750 mg | tablet | Freq: Three times a day (TID) | ORAL | 0 refills | 0 days | Status: AC | PRN

## 2018-12-15 MED ORDER — KETOROLAC TROMETHAMINE 30 MG/ML INJ
30.00 mg | Freq: Once | Status: AC
Start: 2018-12-15 — End: 2018-12-15
  Administered 2018-12-15: 30 mg via INTRAMUSCULAR
  Filled 2018-12-15: qty 1

## 2018-12-15 MED ORDER — ACETAMINOPHEN 500 MG PO TABS
1000.00 mg | ORAL_TABLET | Freq: Once | ORAL | Status: AC
Start: 2018-12-15 — End: 2018-12-15
  Administered 2018-12-15: 1000 mg via ORAL
  Filled 2018-12-15: qty 2

## 2018-12-15 MED ORDER — LIDOCAINE 4 % EX PTCH
1.0000 | MEDICATED_PATCH | CUTANEOUS | Status: DC
Start: 2018-12-15 — End: 2018-12-15
  Administered 2018-12-15: 1 via TOPICAL
  Filled 2018-12-15: qty 1

## 2018-12-15 MED ORDER — DIAZEPAM 5 MG PO TABS
5.00 mg | ORAL_TABLET | Freq: Once | ORAL | Status: AC
Start: 2018-12-15 — End: 2018-12-15
  Administered 2018-12-15: 5 mg via ORAL
  Filled 2018-12-15: qty 1

## 2018-12-15 MED ORDER — IBUPROFEN 600 MG PO TABS: 600 mg | tablet | Freq: Three times a day (TID) | ORAL | 0 refills | 0 days | Status: AC | PRN

## 2018-12-15 MED ORDER — METHOCARBAMOL 750 MG PO TABS
750.00 mg | ORAL_TABLET | Freq: Three times a day (TID) | ORAL | 0 refills | Status: AC | PRN
Start: 2018-12-15 — End: 2018-12-20

## 2018-12-15 MED ORDER — IBUPROFEN 600 MG PO TABS
600.0000 mg | ORAL_TABLET | Freq: Three times a day (TID) | ORAL | 0 refills | Status: AC | PRN
Start: 2018-12-15 — End: 2018-12-22

## 2018-12-15 NOTE — Telephone Encounter (Signed)
I returned the call to this pt's husband using a [place] interpreter . He states that his wife was seen in the ER today c/o'ing of back pain. He states that she's having severe rt lower back pain. He states that she can't even walk without his help. The ER diagnosed her with acute rt sided low back pain,unspecified whether sciatica present. She was given IM Toradol ,Tylenol,Diazepam 5 mg's and a Lidocaine patch in the ER. Appt scheduled tomorrow AM with Dr. Roby Lofts.

## 2018-12-15 NOTE — ED Triage Note (Signed)
Back pain for 2 days. No trauma. No meds taken

## 2018-12-15 NOTE — Narrator Note (Addendum)
Patient Disposition    Patient education for diagnosis, medications, activity, diet and follow-up.  Patient left ED 12:31 PM.  Patient rep received written instructions.  Interpreter to provide instructions: No, declined    Patient belongings with patient: YES    Have all existing LDAs been addressed? N/A    Have all IV infusions been stopped? N/A    Discharged to: Discharged to home  Discharge instructions explained to patient including follow up care and prescriptions patient is agreeable to DC plan, provided teach back.  Stressed importance of follow up with PCP for further eval including MRI

## 2018-12-15 NOTE — ED Provider Notes (Signed)
ED nursing record was reviewed. Prior records as available electronically through the Epic record were reviewed.    Patient is portugese speaking and interview, exam, and final disposition were conducted with family member as interpreter per patient's request, official hospital interpreter was offered and declined.    HPI:    This 48 year old female patient presents to the Emergency Department with chief complaint of right lower back pain.  Patient reports that for the last 3 days she is been experiencing right lower back pain, and it started after "moving strangely" while she was in the bathroom.  Pain is worsened by leaning forward or turning her torso, slightly worse with touching the area.  She reports that she is had this pain in the past and believes she has been told she has "disc issues".  Comes to the emergency department for further evaluation.  Pain does not radiate into her extremities.  No fevers, chills, rash, headache, neck pain, chest pain, shortness of breath, nausea, vomiting, diarrhea, abdominal pain, urinary symptoms, hematuria, flank pain, motor neuro deficits, saddle anesthesia, issues of bowel or bladder incontinence or retention.       ROS: Pertinent positives were reviewed as per the HPI above. All other systems were reviewed and are negative.      Past Medical History/Problem list:  Past Medical History:  No date: Headache disorder  Patient Active Problem List:     Anxiety state     Headache disorder        Past Surgical History: Past Surgical History:  No date: CESAREAN SECTION, LOW TRANSVERSE  No date: SALPINGECTOMY COMPLETE/PARTIAL UNI/BI SPX      Comment:  HO PEC      Medications:   No current facility-administered medications on file prior to encounter.   No current outpatient medications on file prior to encounter.      Social History:   Social History     Socioeconomic History    Marital status: Married     Spouse name: Not on file    Number of children: Not on file    Years of  education: Not on file    Highest education level: Not on file   Occupational History    Not on file   Social Needs    Financial resource strain: Not on file    Food insecurity:     Worry: Not on file     Inability: Not on file    Transportation needs:     Medical: Not on file     Non-medical: Not on file   Tobacco Use    Smoking status: Never Smoker    Smokeless tobacco: Never Used   Substance and Sexual Activity    Alcohol use: No    Drug use: No    Sexual activity: Not on file   Lifestyle    Physical activity:     Days per week: Not on file     Minutes per session: Not on file    Stress: Not on file   Relationships    Social connections:     Talks on phone: Not on file     Gets together: Not on file     Attends religious service: Not on file     Active member of club or organization: Not on file     Attends meetings of clubs or organizations: Not on file     Relationship status: Not on file    Intimate partner violence:  Fear of current or ex partner: Not on file     Emotionally abused: Not on file     Physically abused: Not on file     Forced sexual activity: Not on file   Other Topics Concern    Not on file   Social History Narrative    Here for 5 months    Here from [place]    House cleaner and restaurant           Allergies:  Review of Patient's Allergies indicates:  No Known Allergies      Physical Exam:  BP 141/83  Pulse 77  Temp 98.1 F  Resp 20  Wt 74 kg (163 lb 2.3 oz)  SpO2 98%  BMI 28.91 kg/m2    GENERAL: Well appearing, No acute distress, non-toxic.   SKIN:  Warm & Dry, no erythema or rash.  HEAD:  NCAT. Sclerae are anicteric and aninjected, oropharynx is clear with moist mucous membranes. PERRL. EOMI. B TMs clear.  NECK:  No C-spine tenderness, crepitus, step-off.  No meningismus.  BACK: No obvious deformity.  No midline tenderness, crepitus or step-off.  Mild discomfort to palpation in the right lower lumbar musculature.  Decreased range of motion with bending forward and turning to  the right as these increased pain.  LUNGS:  Clear to auscultation bilaterally.  HEART:  RRR.    ABDOMEN:  Soft, NTND.   EXTREMITIES:  No obvious deformities.  CSM intact x 4.    GENITOURINARY:  No CVA tenderness.   NEUROLOGIC:  Alert and oriented x4; moves all extremities well; speaking in clear fluent sentences. CNsII-XII symmetrical and intact. Sensation intact to light touch throughout. 5/5 strength globally  PSYCHIATRIC:  Appropriate for age, time of day, and situation      ED Course and Medical Decision-making:  This 48 year old female patient was seen and evaluated for her right lower back pain.  On exam she appears well, nontoxic.  She is afebrile, blood pressure mildly elevated, vital signs otherwise normal.  Patient's pain is exacerbated by her sitting forward in the bed and turning her torso, she is mildly tender across the right lower lumbar musculature.  She has no urinary symptoms, no flank pain, no history of kidney stones, do not suspect a kidney stone.  I did offer to check a urine however patient declines.     She has no midline tenderness, no red flags to suggest vertebral fracture, cauda equina, spinal compression, epidural abscess or mass.  No indication for emergent spinal imaging at this time however given that the patient does report a history of "disc issues" I recommended strongly that she call her primary care doctor today to set up an appointment for follow-up and at that point they can decide if she would benefit from further testing such as an outpatient MRI and/or physical therapy.     In the meantime we will treat her pain, she was given Tylenol, Toradol, lidocaine patch and Valium for muscle relaxant.  Discharged home with prescriptions and encouraged to call her primary care provider today to set up a follow-up appointment for later this week.  Reasons to return to the ED discussed.      Condition: Stable  Disposition: Home      Diagnosis/Diagnoses:  Acute right-sided low back pain,  unspecified whether sciatica present    Cathleen Corti, PA-C    This Emergency Department patient encounter note was created using voice-recognition software and in real time during  the ED visit. Please excuse any typographical errors that have not been edited out.

## 2018-12-15 NOTE — Telephone Encounter (Signed)
Regarding: severe back pain   ----- Message from Murvin Donning sent at 12/15/2018  1:16 PM EST -----  Christy Palmer [id number], 48 year old, female    Calls today:  Sick    What are the symptoms Urbano Heir states pt having  Severe back pain   How long has patient been sick? yesterday  What has pt. tried at home prescribed mdd  Person calling on behalf of patient: Spouse    Cleotis Lema NUMBER: [phone]  Best time to call back: anytime  Cell phone:   Other phone:    Patient's language of care: [place] ([place])    Patient needs a [place] interpreter.    Patient's PCP: Laureen Abrahams, MD

## 2018-12-15 NOTE — Discharge Instructions (Addendum)
DIAGNOSIS & TREATMENT:  You were seen in a [facility] Emergency Department for right sided lower back pain.  Seems muscular however given your history of "disc issues" you should discuss further with your primary care provider about scheduling an outpatient test such as MRI    FURTHER CARE:  Please apply hot water bottle or heating pad to the affected area for 20 minutes at a time to help relax the muscles.     Use ibuprofen for baseline pain control and use robaxin as needed for additional relief.     NO LIFTING  or strenuous exercise until your symptoms have resolved.   Walking and gently stretching is encouraged     I would suggest you wear more supportive shoe wear such as sneakers.     Be sure to follow up with your PCP this week for re-evaluation.     NEW MEDICATIONS:  You were given a prescription for ibuprofen, an NSAID anti-inflammatory. Please use this as needed for baseline pain or fever control. Please take this with food to prevent stomach irritation. Do not take more than 3200mg  of this medication in any 24 hour period.    You were given a prescription for Robaxin a muscle relaxant.    This medication helps calm the muscle down reducing the spasms and pain.   These medications can make you drowsy and therefore you should not drive a car or operate heavy machinery while taking.    WHEN SHOULD YOU BE SEEN NEXT?  Please call your doctor today and setup a follow up appointment this week for re-evaluation.     WHEN SHOULD YOU RETURN TO THE ED?  Please return to the emergency room if experience uncontrolled vomiting that prevents you from tolerating fluids, you develop fever, shooting pain down both legs, you can not control your bowels or bladder, you feel you have to urinate but are unable to produce a stream, your legs become numb or weak, your symptoms worsen, you get new symptoms or you are unable to schedule follow up care.

## 2018-12-16 ENCOUNTER — Encounter (HOSPITAL_BASED_OUTPATIENT_CLINIC_OR_DEPARTMENT_OTHER): Payer: Self-pay | Admitting: Family Medicine

## 2018-12-16 ENCOUNTER — Ambulatory Visit: Payer: No Typology Code available for payment source | Attending: Family Medicine | Admitting: Family Medicine

## 2018-12-16 VITALS — BP 110/80 | HR 75 | Temp 98.2°F

## 2018-12-16 DIAGNOSIS — M545 Low back pain, unspecified: Secondary | ICD-10-CM

## 2018-12-16 MED ORDER — ACETAMINOPHEN 500 MG PO TABS
1000.00 mg | ORAL_TABLET | Freq: Three times a day (TID) | ORAL | 0 refills | Status: AC | PRN
Start: 2018-12-16 — End: 2018-12-23

## 2018-12-16 MED ORDER — ACETAMINOPHEN 500 MG PO TABS: 1000 mg | tablet | Freq: Three times a day (TID) | ORAL | 0 refills | 0 days | Status: AC | PRN

## 2018-12-16 MED FILL — *IBUPROFEN 600MG: 7 days supply | Qty: 20 | Fill #0 | Status: CP

## 2018-12-16 MED FILL — *ACETAMIN 500MG: 10 days supply | Qty: 60 | Fill #0 | Status: CP

## 2018-12-16 MED FILL — METHOCARBAM 750MG: 5 days supply | Qty: 15 | Fill #0 | Status: CP

## 2018-12-16 NOTE — Patient Instructions (Signed)
Patient Education   Index Spanish     Adult Advisor 2017.1 published by [employer].  Last reviewed: 2015-04-20

## 2018-12-16 NOTE — Progress Notes (Signed)
[facility]    Subjective:   Christy Palmer is a 48 year old female who is here for:  Back Pain   - Symptoms started 2 days ago   - no trauma   - just took the shoe off in an awkward position and got severe back pain   - very hard to move   - took [place] medication this morning   - also took 600 mg of Advil this AM  - hasn't picked up the meds from the ED yet  - the pain didn't really go away with the medications from the hospital   - no incontinence   - no saddle anesthesia   - no fevers, chills, night sweats   - R lumbar pain   - doesn't radiate down the leg   - not had back pain like this in the past   - wondering about need for imaging     ROS:  Focused, as per HPI     Patient Active Problem List:     Anxiety state     Headache disorder      MEDS:    Current Outpatient Medications:     ibuprofen (ADVIL) 600 MG tablet, Take 1 tablet by mouth every 8 (eight) hours as needed pain  for up to 7 days, Disp: 20 tablet, Rfl: 0    methocarbamol (ROBAXIN) 750 MG tablet, Take 1 tablet by mouth 3 (three) times daily as needed  for up to 5 days, Disp: 15 tablet, Rfl: 0  No current facility-administered medications for this visit.     Review of Patient's Allergies indicates:  No Known Allergies    Objective:  BP 110/80 (Site: LA, Position: Laying Down)  Pulse 75  Temp 98.2 F (36.8 C) (Temporal)  SpO2 99%  GEN:  Well appearing, NAD  HEENT: NC/AT  Back: No obvious deformity, ecchymosis, or erythema of back. No significant scoliosis. Patient with very restricted ROM due to pain. No tenderness over the spinous processes. TTP R lumbar paraspinal muscles and R SI joint. Negative SLR bilaterally. Positive FABER. 5/5 strength in the bilateral lower extremities. Normal sensation bilaterally. 2+ patellar reflexes.      Assessment and Plan:  Christy Palmer is a 48 year old female with    1. Acute right-sided low back pain without sciatica  48 yo F with acute atraumatic R LBP without radiation.  Pain likely secondary to muscle spasm based on presentation. No red flags. Counseled today on possible origins of pain. Discussed that imaging is not going to change management.   - Encourage patient to avoid bed rest  - Given exercises to start at home   - Instructed to pick up ibuprofen and Robaxin and take as directed by ED   - Encourage heat to help with muscle relaxation   - REFERRAL TO PHYSICAL THERAPY ( INT)  - acetaminophen (TYLENOL) 500 MG tablet; Take 2 tablets by mouth every 8 (eight) hours as needed for Pain  for up to 7 days  Dispense: 60 tablet; Refill: 0   - given severity of pain and issues with mobility, offered 1-2 days of tramadol, however patient declines  - counseled to seek care for any incontinence, saddle anesthesia, fevers, chills, night sweats, or other concerns     I have reviewed the past medical history and updated sections of EpicCare as relevant. Medications were reconciled during this visit and a current medication list was given to the patient at the end of  the visit.    We discussed the patients current medications. The patient expressed understanding and no barriers to adherence were identified.     Claudia Pollock, MD, 12/16/2018

## 2018-12-23 ENCOUNTER — Encounter (HOSPITAL_BASED_OUTPATIENT_CLINIC_OR_DEPARTMENT_OTHER): Payer: Self-pay | Admitting: Family Medicine

## 2018-12-23 ENCOUNTER — Ambulatory Visit: Payer: No Typology Code available for payment source | Attending: Family Medicine | Admitting: Family Medicine

## 2018-12-23 VITALS — BP 117/74 | HR 67 | Temp 97.9°F | Wt 186.2 lb

## 2018-12-23 DIAGNOSIS — R079 Chest pain, unspecified: Secondary | ICD-10-CM | POA: Diagnosis not present

## 2018-12-23 LAB — COMP METABOLIC PANEL, FASTING
ALANINE AMINOTRANSFERASE: 73 U/L — ABNORMAL HIGH (ref 12–45)
ALBUMIN: 3.7 g/dL (ref 3.4–5.0)
ALKALINE PHOSPHATASE: 120 U/L — ABNORMAL HIGH (ref 45–117)
ANION GAP: 6 mmol/L (ref 5–15)
ASPARTATE AMINOTRANSFERASE: 33 U/L (ref 8–34)
BILIRUBIN TOTAL: 0.3 mg/dL (ref 0.2–1.0)
BUN (UREA NITROGEN): 15 mg/dL (ref 7–18)
CALCIUM: 9.8 mg/dL (ref 8.5–10.1)
CARBON DIOXIDE: 27 mmol/L (ref 21–32)
CHLORIDE: 107 mmol/L (ref 98–107)
CREATININE: 0.6 mg/dL (ref 0.4–1.2)
ESTIMATED GLOMERULAR FILT RATE: >60 mL/min (ref >60)
GLUCOSE FASTING: 91 mg/dL (ref 74–106)
POTASSIUM: 4.5 mmol/L (ref 3.5–5.1)
SODIUM: 140 mmol/L (ref 136–145)
TOTAL PROTEIN: 7.1 g/dL (ref 6.4–8.2)

## 2018-12-23 NOTE — Progress Notes (Signed)
[facility]    CC:  Patient presents with:  Palpitations        HPI:   Christy Palmer is a 48 year old who is here for Patient presents with:  Palpitations        # L chest pain  2 wks ago L sided chest pain radiating to back returned  - had same pain 2 years ago; went away on its own  - it comes and goes all day long, stabbing pain  - started on scapula gives numbing sensation through L arm   - worse SOB with laying down, but also at rest  - feels heart is racing  - feels like something is torn inside  - tried chamomile and other soothing teas, and it helps calm her down  - it happens frequently but being nervous makes it worse  - no cough but if laughing really hard then cough starts and difficult to stop  - was given a referral for another cardiac test in [place] but did not do the second test  -gets swelling in hands a lot, feels like its swollen now    - concerned that it may be gallbladder related    An [place] interpreter was required during this encounter.     ROS  Review of systems conducted and pertinent review of systems as noted in HPI.    Patient Active Problem List:     Anxiety state     Headache disorder      MEDS:    Current Outpatient Medications:     acetaminophen (TYLENOL) 500 MG tablet, Take 2 tablets by mouth every 8 (eight) hours as needed for Pain  for up to 7 days, Disp: 60 tablet, Rfl: 0    Review of Patient's Allergies indicates:  No Known Allergies    Objective:  BP 117/74  Pulse 67  Temp 97.9 F (36.6 C) (Temporal)  Wt 84.5 kg (186 lb 3.2 oz)  SpO2 97%  BMI 32.99 kg/m2  GEN:  Anxious appearing. Taking deep breaths and clutching L breast  HEENT:  PERRLA, sclera non-injected, neck is supple with no adenopathy.  Oropharynx is normal and without exudate.  HEART:  RRR, no murmurs, rubs or gallops..  No pain on palpation of the chest wall.   PULM: Lungs clear to auscultation bilaterally, no wheezes, crackles or rhonchi.   ABD: abdomen is soft without significant  tenderness, masses, organomegaly or guarding  EXT: normal, no cyanosis, clubbing and no edema  MSK:  No gross deformities noted at the neck, shoulder, or back. No tenderness to palpation of the shoulder, back, arm, neck or chest b/l.  AROM at L shoulder full without limitation or tenderness noted on movement.  Hawkins negative.  Neurovascularly intact distally on L and R.   NEURO: AO x 3. CN grossly intact.   PSYCH:  Alert, oriented, mood anxious affect normal, speech normal, thought process goal-oriented    GAD7:  10    Assessment and Plan:  Christy Palmer is a 48 year old female who presented to clinic today for Patient presents with:  Palpitations    1. Chest pain, unspecified type  Pt with concerns of chest pain similar to that which she was evaluated in the ED 2 years ago.  Pt concerned for cardiac etiology.  No chest wall tenderness noted on exam.  Heart sounds normal without any murmurs.  VS wnl.  Lung fields clear with good breath sounds b/l.  EKG  obtained during this visit showing NSR without concern for STE or depression.  Inquired with patient whether she thought her anxiety played any role, and she denied feeling pain related to anxiety.  GaD 7 today at 10.  Pt voiced concern that it may be her gallbladder because a lot in her family had gallbladder issues.  No abdominal pain reported and no tenderness noted on abdominal exam.  Ordered CMP given patient concern.   Advised to continue tylenol, advil and muscle relaxers for pain control.   Return precautions given as well as when to seek immediate medical attention.   - EKG  - COMP METABOLIC PANEL, FASTING      Follow-up: 2 weeks    I have reviewed the past medical, surgical, social and family history and updated these sections of EpicCare as relevant. All interim labs, test results, and consult notes were reviewed and discussed with Christy Palmer. Medications were reconciled during this visit and a current medication list was given to the patient  at the end of the visit.    We discussed the patients current medications. The patient expressed understanding and no barriers to adherence were identified.     Discussed with Dr. Genelle Bal, MD

## 2018-12-24 LAB — EKG

## 2018-12-26 NOTE — Progress Notes (Signed)
PRECEPTOR NOTE:  On the day of the patient's visit, I discussed the key elements of history and physical exam and I reviewed the findings with the resident.  I agree with the assessment and plan as described in their documentation.  Please see resident's note for further details.    Amaro Mangold, MD

## 2019-01-18 ENCOUNTER — Ambulatory Visit
Payer: No Typology Code available for payment source | Attending: Family Medicine | Admitting: Rehabilitative and Restorative Service Providers"

## 2019-01-18 DIAGNOSIS — S39012D Strain of muscle, fascia and tendon of lower back, subsequent encounter: Secondary | ICD-10-CM | POA: Diagnosis present

## 2019-01-18 DIAGNOSIS — R079 Chest pain, unspecified: Secondary | ICD-10-CM | POA: Diagnosis not present

## 2019-01-18 DIAGNOSIS — S39012A Strain of muscle, fascia and tendon of lower back, initial encounter: Secondary | ICD-10-CM | POA: Insufficient documentation

## 2019-01-18 NOTE — Progress Notes (Signed)
Outpatient Physical Therapy Initial Evaluation  [facility]: [place]    Patient: Christy Palmer , DOB May 30, 1971  ICD-10:  [S39.012D]    Christy Palmer is a 48 year old female who presents today for PT evaluation of low back pain. This problem began 12/13/18 due to suddenly going into spasm when she moved a certain way while in the bathroom. So far, patient has tried the following treatments: heat, medication. Since onset of problem, things have improved slightly.    Patient learns best by interpreter.    Imaging:  none    Pain:  Current:7/10  Best:3/10   Better with medication, heat  Worst:10/10   Worse with bending, sitting, slouching, stairs  Pain is described as stinging and is located right lumbosacral region.      Neuro:  Paraesthesia? denies   Bowel/Bladder Changes? denies      Function  ADL's: Pain with bathing, dressing. 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  No current facility-administered medications on file prior to visit.     Objective Exam:   *denotes pain with testing    Posture:  Significant increase in lumbar lordosis, anterior pelvic tilt. Wearing wedge sandals.     Multisegmental AROM  Flexion 75%*   Extension 100%*   SBR 100%*   SBL 100%*   Rotation R 75%*   Rotation L 75%*     Balance: poor static SLS R/poor static SLS L (hesitant to trial SLS due to knee pain?)  Gait: nonantalgic gait, no trunk or pelvic movement    DTRs  Patellar: 2+ R/2+ L  Achilles: 2+ R/2+ L     Relevant MMT/Myotomes   RIGHT LEFT   Quad (L3-4) 4-/5 4-/5*    Hamstrings (L5-S1) 4-/5* 4-/5*   Ant tib (L4) 5/5 5/5   Evertors (L5) 5/5 5/5   Glut med  4-/5 4-/5   Glut max  NT/5 NT/5     AROM  RLE: pain at end range flex and ext knee   LLE: pain at end range flex and ext knee    Muscle Length  RLE: short anterior chain  LLE: short anterior chain    Palpation:  TTP: none  Tissue Density: WNL  Alignment: WNL    Joint Mobility:  Hypermobile lumbar spine    Special Tests:  (-) slump, SLR        Physical Therapy Plan of Care    PCP:  Laureen Abrahams, MD    Referring Provider:  Claudia Pollock, MD    Diagnosis:   Acute myofascial strain of lumbar region, subsequent encounter [S39.012D]    Assessment/Objective Findings:   Christy Palmer is a 48 year old year old female who presents to [facility] outpatient physical therapy with complaints of pain in her low back. Patient reports onset of pain 12/13/18 due to sudden spasm from a small movement. 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 Pt expresses long term goal of eliminating pain, is motivated  to work towards this in PT.    Clinical presentation today is consistent with acute lumbar strain and pt will benefit from skilled PT focused on symptom management and dynamic stability to address the following problems and impairments noted upon evaluation: pain, significant diffuse weakness, and motor control dysfunction/poor postural proprioception. These problems limit the patient with the following functional activities: all ADL's, work duties, Psychologist, counselling. The prescribed treatment plan of care is medically necessary to address afore mentioned impairments and return to maximal level of function.    No barriers to treatment were identified and taken into considerations of plan of care. The rehabilitation potential for this patient is good with a stable clinical presentation due to above listed barriers, functional limitations, and clinical exam.    GOALS:  Short  Term Functional Goals: 2 weeks.   Patient will demonstrate improved muscle length to WNL throughout BLE's  Patient will demonstrate improved strength to 4/5 throughout BLE's  Patient will demonstrate fair core stability with good control in supine and prone  Patient will be independent with initial HEP    Long Term Goal: 4 weeks.   Patient will demonstrate improved bilateral hip strength to 5/5 and good dynamic core stability so that she will be able to complete all ADL's and stair negotiation pain-free  Patient will demonstrate improved squat and lift mechanics so that she will be able to complete all household chores and work duties pain-free  Patient will be independent with HEP    Treatment Plan:  ** PT Eval - Low Complexity (CPT 97161)  ** Stretching/ROM Exercise/Therapeutic Exercise/Home Exercise Program [phone])  ** Neuromuscular Re-education (CPT O1995507)  ** Joint Mobilization/Soft Tissue Mobilization/Manual Traction (CPT U835232)    Recommend Physical Therapy be continued 1 time per week for 4 weeks.    Patient [name] is aware of attendance policy: Yes  Plan of care discussed with Patient/Family: Yes  Patient goals reviewed and incorporated in plan of care: Yes  Patient/Family agrees with plan of care: Yes  Patient/Family education: Yes  Does patient feel safe at home: Yes      Thank you for your referral. Please feel free to contact me with any questions or concerns.      Bynum Bellows, PT, DPT, OCS, CSCS  Lic. #[id number]  Staff Physical Therapist  [facility]: [place]  [email or web address]  Phone: [phone]  [phone]

## 2019-01-18 NOTE — Progress Notes (Signed)
I certify that the documented Treatment Plan is reasonable and necessary.    01/18/2019  Claudia Pollock, MD

## 2019-01-22 ENCOUNTER — Encounter (HOSPITAL_BASED_OUTPATIENT_CLINIC_OR_DEPARTMENT_OTHER): Payer: Self-pay

## 2019-01-22 NOTE — Progress Notes (Signed)
[facility]. Care Coordination Program. Pt outreached to book mammogram. Appt scheduled on 03/10. Reminder letter will mailed.

## 2019-01-25 ENCOUNTER — Ambulatory Visit (HOSPITAL_BASED_OUTPATIENT_CLINIC_OR_DEPARTMENT_OTHER): Payer: Self-pay | Admitting: Rehabilitative and Restorative Service Providers"

## 2019-01-28 MED FILL — BOOSTRIX INJ: 1 days supply | Qty: 1 | Fill #0 | Status: CP

## 2019-02-01 ENCOUNTER — Ambulatory Visit
Payer: No Typology Code available for payment source | Attending: Family Medicine | Admitting: Rehabilitative and Restorative Service Providers"

## 2019-02-01 ENCOUNTER — Encounter (HOSPITAL_BASED_OUTPATIENT_CLINIC_OR_DEPARTMENT_OTHER): Payer: Self-pay

## 2019-02-01 DIAGNOSIS — R079 Chest pain, unspecified: Secondary | ICD-10-CM | POA: Diagnosis present

## 2019-02-01 DIAGNOSIS — M545 Low back pain: Secondary | ICD-10-CM | POA: Diagnosis present

## 2019-02-01 DIAGNOSIS — S39012D Strain of muscle, fascia and tendon of lower back, subsequent encounter: Secondary | ICD-10-CM | POA: Diagnosis present

## 2019-02-01 NOTE — Progress Notes (Signed)
Reminder call for mammogram tomorrow. Left a message.

## 2019-02-01 NOTE — Progress Notes (Signed)
S:  Patient reports R LS pain today 5/10.  She is compliant with HEP and states she feels "a little better".  She c/o B knee pain 8/10.     O:  Refer to rehab tx flow sheet; educated pt on proper foot wear for PT sessions; pt wearing high heel sandals today.     A:  Patient responded well to tx; difficulty controlling eccentric CS w/ LLE; good return demonstration of bridge and breathing.      P:  Follow POC; start with global bike       02/01/19 1200   Language Information   Language of Care Portuguese   Interpreter Yes   Precautions   Precautions No   Rehab Discipline   Rehab Discipline PT   Visit   Visit number 2   POC Due date 02/17/19   Time Calculation   Start Time 1235   Stop Time 1300   Time Calculation (min) 25 min   Pain   Pain Score 5    Manual Therapy   Manual Therapy No   Ther Exercise   Therapeutic Exercise? Yes   Ther Exercise 3   Exercise 3 LTR   Reps 3 10   Sets 3 1   Ther Exercise 5   Exercise 5 bridges   Reps 5 10   Sets 5 2   Holds 5 w/ breathing   Ther Exercise 6   Exercise 6 HL clams alternate   Reps 6 10   Sets 6 2   Holds 6 TVA to prevent trunk rotation   Patient Education   What was taught? HEP add HL CS and bridge   Method Verbal;Demo;Practice   Patient comprehension Yes       Sarita Haver, PTA, Lic # [id number]

## 2019-02-02 ENCOUNTER — Ambulatory Visit (HOSPITAL_BASED_OUTPATIENT_CLINIC_OR_DEPARTMENT_OTHER): Payer: Self-pay

## 2019-02-03 ENCOUNTER — Encounter (HOSPITAL_BASED_OUTPATIENT_CLINIC_OR_DEPARTMENT_OTHER): Payer: Self-pay

## 2019-02-03 NOTE — Progress Notes (Signed)
[facility] Care Coordination [facility]. Pt outreached to reschedule a Mammogram. Left message on voicemail.

## 2019-02-08 ENCOUNTER — Ambulatory Visit (HOSPITAL_BASED_OUTPATIENT_CLINIC_OR_DEPARTMENT_OTHER): Payer: Self-pay | Admitting: Rehabilitative and Restorative Service Providers"

## 2019-02-08 ENCOUNTER — Telehealth (HOSPITAL_BASED_OUTPATIENT_CLINIC_OR_DEPARTMENT_OTHER): Payer: Self-pay | Admitting: Rehabilitative and Restorative Service Providers"

## 2019-02-08 NOTE — Progress Notes (Signed)
Called patient and left message regarding cancellation of future appointments in efforts to slow the spread of Covid-19. Recommended patient continue home exercises and contact this therapist by phone or email with any questions or concerns.    Chessie Neuharth, PT, DPT, OCS, CSCS   Lic [id number]

## 2019-02-15 ENCOUNTER — Ambulatory Visit (HOSPITAL_BASED_OUTPATIENT_CLINIC_OR_DEPARTMENT_OTHER): Payer: Self-pay | Admitting: Rehabilitative and Restorative Service Providers"

## 2019-02-16 ENCOUNTER — Emergency Department
Admission: EM | Admit: 2019-02-16 | Discharge: 2019-02-16 | Disposition: A | Discharge status: Home / Self Care | Payer: No Typology Code available for payment source | Source: Intra-hospital | Attending: Emergency Medicine | Admitting: Emergency Medicine

## 2019-02-16 ENCOUNTER — Emergency Department (HOSPITAL_BASED_OUTPATIENT_CLINIC_OR_DEPARTMENT_OTHER): Payer: No Typology Code available for payment source

## 2019-02-16 ENCOUNTER — Encounter (HOSPITAL_BASED_OUTPATIENT_CLINIC_OR_DEPARTMENT_OTHER): Payer: Self-pay

## 2019-02-16 ENCOUNTER — Other Ambulatory Visit: Payer: Self-pay

## 2019-02-16 DIAGNOSIS — R55 Syncope and collapse: Secondary | ICD-10-CM

## 2019-02-16 DIAGNOSIS — W01198A Fall on same level from slipping, tripping and stumbling with subsequent striking against other object, initial encounter: Secondary | ICD-10-CM | POA: Diagnosis present

## 2019-02-16 DIAGNOSIS — R079 Chest pain, unspecified: Secondary | ICD-10-CM | POA: Diagnosis not present

## 2019-02-16 DIAGNOSIS — W19XXXA Unspecified fall, initial encounter: Secondary | ICD-10-CM

## 2019-02-16 LAB — CBC, PLATELET & DIFFERENTIAL
ABSOLUTE BASO COUNT: 0.1 10*3/uL (ref 0.0–0.1)
ABSOLUTE EOSINOPHIL COUNT: 0.4 10*3/uL (ref 0.0–0.8)
ABSOLUTE IMM GRAN COUNT: 0.04 10*3/uL — ABNORMAL HIGH (ref 0.00–0.03)
ABSOLUTE LYMPH COUNT: 1.7 10*3/uL (ref 0.6–5.9)
ABSOLUTE MONO COUNT: 0.9 10*3/uL (ref 0.2–1.4)
ABSOLUTE NEUTROPHIL COUNT: 5.3 10*3/uL (ref 1.6–8.3)
ABSOLUTE NRBC COUNT: 0 10*3/uL (ref 0.0–0.0)
BASOPHIL %: 0.6 % (ref 0.0–1.2)
EOSINOPHIL %: 4.9 % (ref 0.0–7.0)
HEMATOCRIT: 36 % (ref 34.1–44.9)
HEMOGLOBIN: 12.5 g/dL (ref 11.2–15.7)
IMMATURE GRANULOCYTE %: 0.5 % — ABNORMAL HIGH (ref 0.0–0.4)
LYMPHOCYTE %: 20 % (ref 15.0–54.0)
MEAN CORP HGB CONC: 34.7 g/dL (ref 31.0–37.0)
MEAN CORPUSCULAR HGB: 29.5 pg (ref 26.0–34.0)
MEAN CORPUSCULAR VOL: 84.9 fl (ref 80.0–100.0)
MEAN PLATELET VOLUME: 12.2 fL (ref 8.7–12.5)
MONOCYTE %: 10.6 % (ref 4.0–13.0)
NEUTROPHIL %: 63.4 % (ref 40.0–75.0)
NRBC %: 0 % (ref 0.0–0.0)
PLATELET COUNT: 283 10*3/uL (ref 150–400)
RBC DISTRIBUTION WIDTH STD DEV: 37.6 fL (ref 35.1–46.3)
RED BLOOD CELL COUNT: 4.24 M/uL (ref 3.90–5.20)
WHITE BLOOD CELL COUNT: 8.4 10*3/uL (ref 4.0–11.0)

## 2019-02-16 LAB — BASIC METABOLIC PANEL
ANION GAP: 11 mmol/L (ref 5–15)
BUN (UREA NITROGEN): 11 mg/dL (ref 7–18)
CALCIUM: 8.7 mg/dL (ref 8.5–10.1)
CARBON DIOXIDE: 24 mmol/L (ref 21–32)
CHLORIDE: 102 mmol/L (ref 98–107)
CREATININE: 0.6 mg/dL (ref 0.4–1.2)
ESTIMATED GLOMERULAR FILT RATE: >60 mL/min (ref >60)
Glucose Random: 96 mg/dL (ref 74–160)
POTASSIUM: 4.1 mmol/L (ref 3.5–5.1)
SODIUM: 137 mmol/L (ref 136–145)

## 2019-02-16 LAB — TROPONIN I
TROPONIN I: <0.02 ng/mL (ref 0.00–0.04)
TROPONIN I: <0.02 ng/mL (ref 0.00–0.04)

## 2019-02-16 LAB — HOLD BLUE TOP TUBE

## 2019-02-16 LAB — HCG QUALITATIVE URINE: HCG QUALITATIVE URINE: NEGATIVE

## 2019-02-16 LAB — BLOOD SUGAR FINGERSTICK (POINT OF CARE): FINGERSTICK GLUCOSE: 89 mg/dl (ref 74–160)

## 2019-02-16 LAB — MAGNESIUM: MAGNESIUM: 1.8 mg/dL (ref 1.8–2.4)

## 2019-02-16 LAB — EKG

## 2019-02-16 MED ORDER — ACETAMINOPHEN 325 MG PO TABS
975.0000 mg | ORAL_TABLET | Freq: Once | ORAL | Status: DC
Start: 2019-02-16 — End: 2019-02-16
  Filled 2019-02-16: qty 3

## 2019-02-16 NOTE — Narrator Note (Addendum)
Pt states she feels better and wants to go home. Pt will have a repeat blood test
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
## 2019-02-16 NOTE — Narrator Note (Addendum)
Pt resting on bed. Pt on monitor
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
## 2019-02-16 NOTE — Narrator Note (Signed)
Pt has no chest pain and no headache. Pt feels better. Pt alert and oriented

## 2019-02-16 NOTE — Discharge Instructions (Addendum)
Please call your doctor's office tomorrow for an update, and follow-up appointment.    Continue to monitor your symptoms, your test results today were normal.  We did not think there was anything wrong with your heart.    If your symptoms change or worsen, please return to the emergency room    If you have any pain, please take over-the-counter Tylenol or ibuprofen as needed.    ----    Sheryle Hail para o consultrio do seu mdico amanh para uma atualizao e consulta de [place].    Continue monitorando seus sintomas, seus resultados hoje foram normais. No achamos que havia algo errado com seu corao.    Se seus sintomas mudarem ou piorarem, retorne  sala de emergncia    Se sentir alguma dor, tome Tylenol ou ibuprofeno sem receita, conforme necessrio.

## 2019-02-16 NOTE — Narrator Note (Signed)
MD at bedside. 

## 2019-02-16 NOTE — Narrator Note (Signed)
Tylenol not given pt took 500 mg at 9:00 am. PA notified. Pt to xray

## 2019-02-16 NOTE — Narrator Note (Signed)
Patient Disposition  Patient education for diagnosis, medications, activity, diet and follow-up with PCP phone number provided  Patient left ED 3:34 PM.  Patient received written instructions.    Interpreter to provide instructions: Yes; Interpreter ID: Kerrick/ John    Patient belongings with patient: YES    Have all existing LDAs been addressed? N/A    Have all IV infusions been stopped? N/A    Destination: Discharged to home. Pt alert and oriented. Pt has a steady gait

## 2019-02-16 NOTE — Narrator Note (Signed)
Pt up to bathroom.

## 2019-02-17 LAB — EKG

## 2019-02-18 ENCOUNTER — Telehealth (HOSPITAL_BASED_OUTPATIENT_CLINIC_OR_DEPARTMENT_OTHER): Payer: Self-pay | Admitting: Rehabilitative and Restorative Service Providers"

## 2019-02-18 NOTE — Progress Notes (Signed)
Called patient and left message regarding option to schedule a televisit for continued progression of PT despite inability to get into the clinic as a result of the COVID-19 pandemic.Marland Kitchen Recommended patient return call to this therapist directly via google voice number.    Joya San, PT, DPT, OCS, CSCS   Lic [id number]

## 2019-02-22 ENCOUNTER — Encounter (HOSPITAL_BASED_OUTPATIENT_CLINIC_OR_DEPARTMENT_OTHER): Payer: Self-pay | Admitting: Rehabilitative and Restorative Service Providers"

## 2019-10-18 ENCOUNTER — Ambulatory Visit (HOSPITAL_BASED_OUTPATIENT_CLINIC_OR_DEPARTMENT_OTHER): Payer: Self-pay | Admitting: Registered Nurse

## 2019-10-18 DIAGNOSIS — Z20822 Contact with and (suspected) exposure to covid-19: Secondary | ICD-10-CM

## 2019-10-18 DIAGNOSIS — Z20828 Contact with and (suspected) exposure to other viral communicable diseases: Secondary | ICD-10-CM

## 2019-10-18 NOTE — Telephone Encounter (Signed)
Excell Seltzer, RN, 10/18/2019  Incoming call from pt to CTC.  Asking for appt for Covid testing.   Reports has had a 3 day hx of h/a, fever, dry cough.  No SOB.  No significant PMH.  Is to go to help a friend clean a house this weekend and wants to be sure she does not have Covid.  Testing booked for tom.  Pt agrees.    [place]:    I have confirmed the patient's name and DOB. Yes     Emergency care:    1. Is the patient patient gasping for air or unable to speak? No  2. Does the patient have new severe chest pain? No  3. Is the patient lethargic or does the patient have altered mental status?  No    A) COVID symptoms (criteria for testing):    Does the patient have any of the following:    1. Fever? Yes Subjective  2. New or worsening cough? Yes  3. New or worsening shortness of breath? No  4. New myalgias (muscle aches)? No  5. Anosmia (lack of smell/taste)? No  6. New sore throat? No  7. New runny nose or congestion? Yes  8. New vomiting or diarrhea? No    First day of symptoms:   10/15/19  Current day of symptoms: 3    First day of dyspnea:     If dyspnea, duration of dyspnea: N/A    If fever, duration of fever: N/A    Other symptoms: as above    C) Need for in-person evaluation:    1. Is the patient experiencing respiratory symptoms (dypsnea, orthopnea, dyspnea on exertion, wheezing), worsening respiratory symptoms from baseline, or worsening of a chronic cough? No  2. Has the patient had a fever for > 48 hours? No  3. Is the patient at [place] ? 4 with worsening symptoms? No  4. Does the patient have chest pain (note: new severe chest pain must be referred to ED)? No  5. Is the patient dizzy or lightheaded? No  6. Does the patient have severe sore throat? No  7. Is the patient pregnant? No    D) Need for testing:    1. Does the patient have any symptom of COVID (or is the patient an asymptomatic contact)?  No  2. If no to (1), does the patient desire public health testing?  Yes    Disposition:    1. I have provided the patient with Home Care Advice (use COVIDHOMECARE). Yes   2. I have ordered testing (for symptomatic patients not requiring in-person evaluation). Yes.  3. I have given the patient directions to Drive-Thru Testing (use DRIVETHRUDIRECTIONS) or [facility] (ACCDIRECTIONS), if appropriate. Yes    Final disposition: Patient chart routed to PCP and patient tested by [facility] criteria

## 2019-10-19 ENCOUNTER — Ambulatory Visit
Admission: RE | Admit: 2019-10-19 | Discharge: 2019-10-19 | Disposition: A | Discharge status: Home / Self Care | Payer: No Typology Code available for payment source | Attending: Family Medicine | Admitting: Family Medicine

## 2019-10-19 DIAGNOSIS — Z20822 Contact with and (suspected) exposure to covid-19: Secondary | ICD-10-CM

## 2019-10-19 DIAGNOSIS — U071 COVID-19: Secondary | ICD-10-CM | POA: Insufficient documentation

## 2019-10-19 DIAGNOSIS — Z20828 Contact with and (suspected) exposure to other viral communicable diseases: Secondary | ICD-10-CM

## 2019-10-21 LAB — COVID-19 OUTPATIENT: COVID-19 OUTPATIENT: POSITIVE

## 2019-10-22 ENCOUNTER — Telehealth (HOSPITAL_BASED_OUTPATIENT_CLINIC_OR_DEPARTMENT_OTHER): Payer: Self-pay | Admitting: Internal Medicine

## 2019-10-22 ENCOUNTER — Telehealth (HOSPITAL_BASED_OUTPATIENT_CLINIC_OR_DEPARTMENT_OTHER): Payer: Self-pay

## 2019-10-22 DIAGNOSIS — U071 COVID-19: Secondary | ICD-10-CM

## 2019-10-22 NOTE — Telephone Encounter (Signed)
COVID RESULT CALL AND ASSESSMENT:    Called patient to discuss COVID-19 result.    Result: POSITIVE.    First day of symptoms: 10/15/19     Description of symptoms: She has headache and runny nose. She does think she has fevers. Does have a cough sometimes.    First day of dyspnea (if any, otherwise type N/A):   NO    Worsening dyspnea?  N/A    Counting time 18  Counting number 18    Is the patient self-isolating?  Yes  Household members and whether they are sick or have been tested: Husband -- has same symptoms but hasn't been tested    Medical history:Anxiety, headaches    Assessment:    1. Current day of symptoms: 7  2. Roth score, if performed:  a. Counting number < 10? no  b. Number of seconds counted < 7? no    Plan:    1. The patient requires ACC evaluation (consider for any new respiratory symptom and worsening symptoms particularly DOI > 4): No  2. Secondary diagnosis (e.g. bacterial PNA) suspected: No  3. Based on my assessment of the patient's risk, I am referring the patient to Community Management: No    Counseling:    1. Home care advice (can use COVIDHOMECARE)    a. Self isolation: Advised patient she should stay home for 10 days and until she has experienced symptomatic improvement and has been afebrile without antipyretics for 24 hours: Yes  b. Self quarantine: Advised patient her close contacts should stay home for 14 days and call their PCP if they develop new symptoms Yes    2. Food insecurity:    1. We know it is difficult for our patients to pay for medications, utilities, rent and food during this time. Are you worried about paying for any of those essential needs in the next month? Yes  2. Would free food delivery for the next 2 weeks help you pay for those other essential needs? Yes  3. Can we share your name, phone, address, preferred language, and number of people in your household with a community group that will call you to get free food delivery set up? yes    If patient agrees to  sharing their information with a community based organization, please inform the patient that someone will reach out to them to set the food delivery up and confirm the number of people in the patient's household:     4. Number of people in household: 2    3.   Advised patient that if she develops new symptoms, she should call [facility] M-F 8-5 or her clinic at [phone]: Yes    Disposition: Does not require in-person evaluation or CM, anticipatory guidance given and chart routed to PCP

## 2019-10-22 NOTE — Telephone Encounter (Signed)
[place]  [phone]  Patient calling for covid results

## 2019-12-07 ENCOUNTER — Encounter (HOSPITAL_BASED_OUTPATIENT_CLINIC_OR_DEPARTMENT_OTHER): Payer: Self-pay

## 2019-12-07 NOTE — Progress Notes (Signed)
[facility]. Care Coordination Program. Called pt to book a mammogram and pt stated she will wait for next year. Pt also shared that the last time she had mammogram she had too much pain and will discuss this concerns with pcp.

## 2019-12-16 ENCOUNTER — Encounter (HOSPITAL_BASED_OUTPATIENT_CLINIC_OR_DEPARTMENT_OTHER): Payer: Self-pay

## 2019-12-16 NOTE — Progress Notes (Signed)
[facility] Care Coordination Program. Pt outreached to schedule a Mammogram. Left message on voicemail.

## 2020-01-04 ENCOUNTER — Encounter (HOSPITAL_BASED_OUTPATIENT_CLINIC_OR_DEPARTMENT_OTHER): Payer: Self-pay

## 2020-01-04 NOTE — Progress Notes (Signed)
[facility] Care Coordination Program. Pt outreached to schedule a Mammogram after 6:30 pm. Left message on voicemail.

## 2023-05-25 IMAGING — MR e+1 QUADRIL DIREITO
4 of 6 series · 19 of 48 positions shown · non-contrast
Comparison: none

------------- REPORT [id number] -------------
MRI OF THE RIGHT HIP
TECHNIQUE: The examination was performed using magnetic resonance imaging equipment with sequences, weightings, and specific planes for the segment of interest, without intravenous administration of contrast medium.
TECHNIQUE: Examination performed on magnetic resonance imaging equipment with sequences, weightings, and specific planes for the segment of interest, without intravenous administration of contrast medium.

[Series 5: T1 · coronal · 4.0mm · 0.41mm/px · 6 of 16 slices shown (1 of 2)]
[im 1/16]
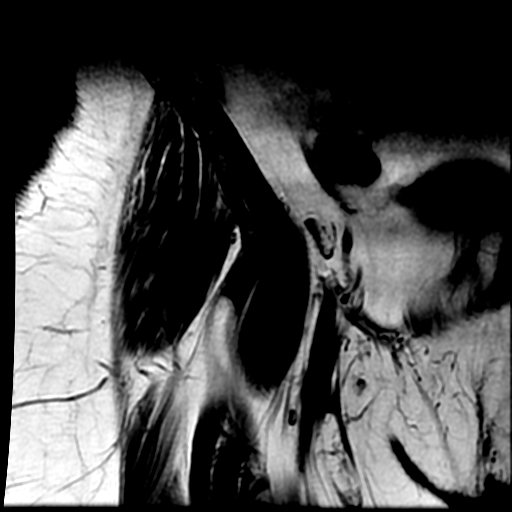
[im 4/16]
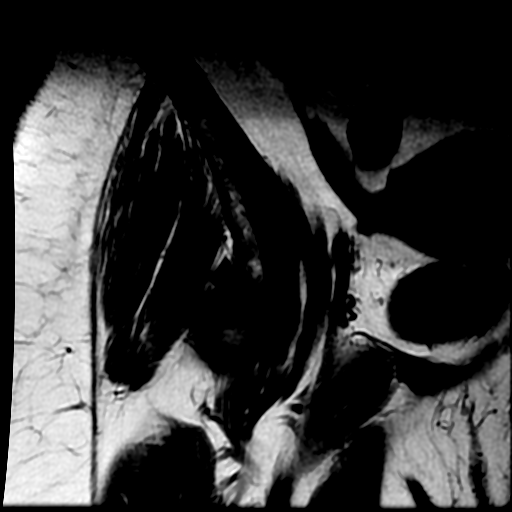
[im 7/16]
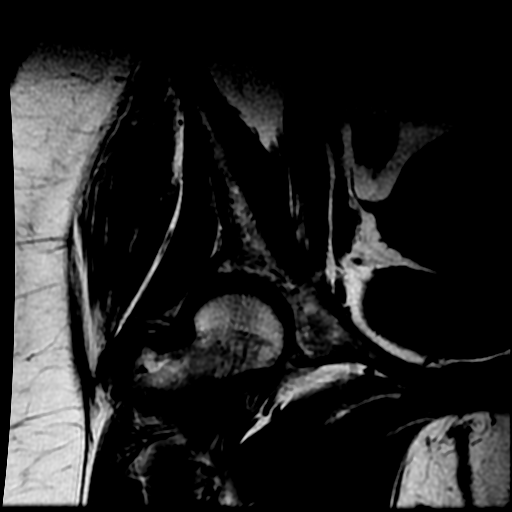
[im 10/16]
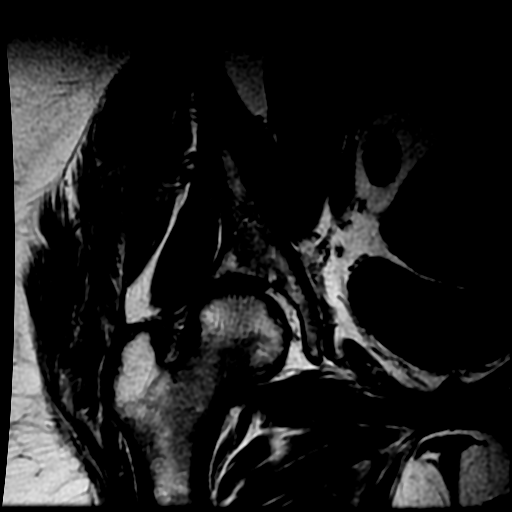
[im 13/16]
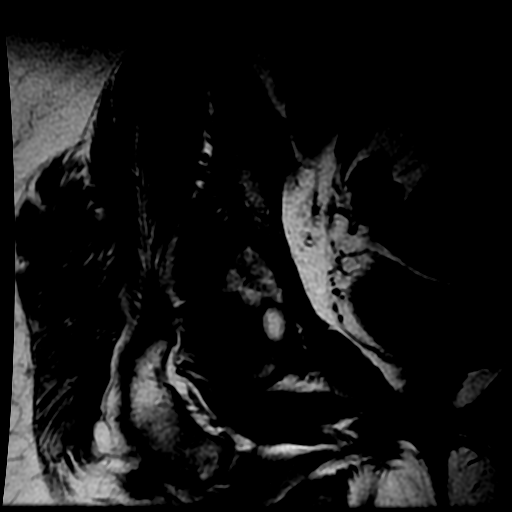
[im 16/16]
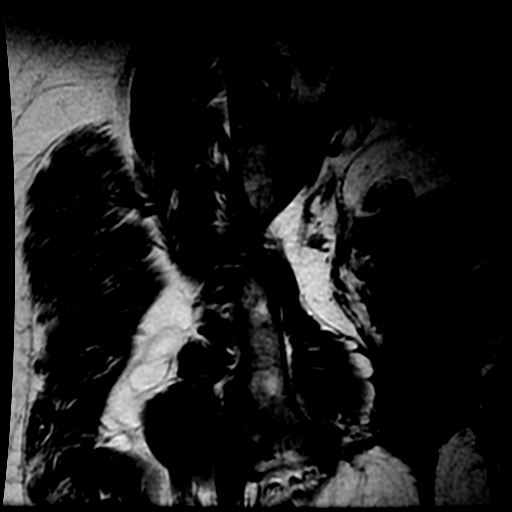

[Series 6: cor dp fs · coronal · 4.0mm · 0.41mm/px · 3 of 16 slices shown]
[im 3/16]
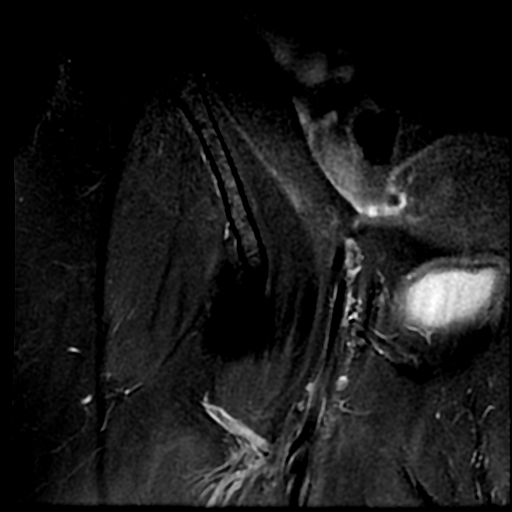
[im 8/16]
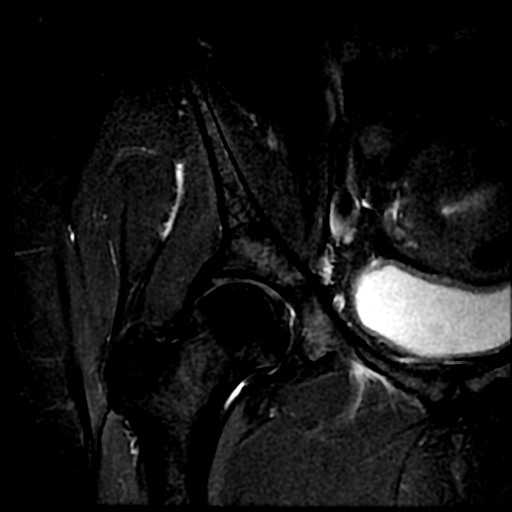
[im 13/16]
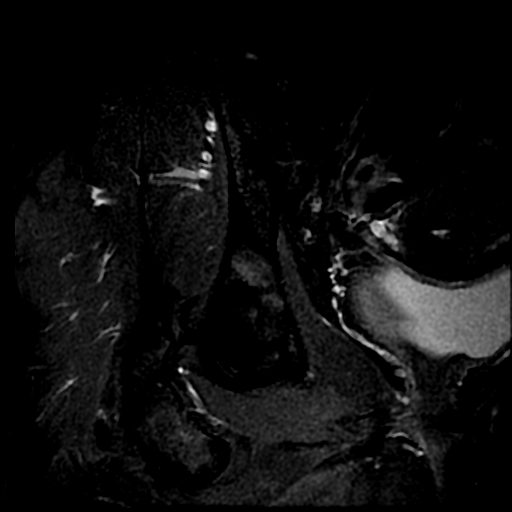

[Series 7: ax dp fs · axial · 4.0mm · 0.31mm/px · z∈[-45,+35]mm · 3 of 22 slices shown]
[im 3/22]
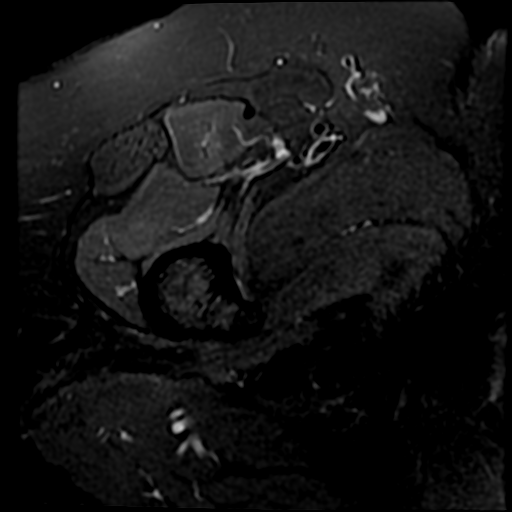
[im 12/22]
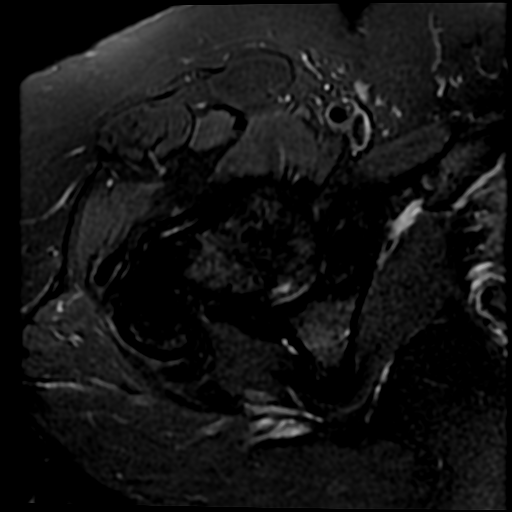
[im 19/22]
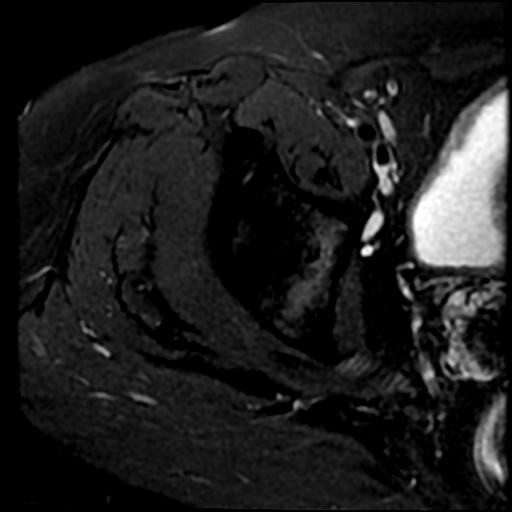

[Series 8: T1 · axial · 4.0mm · 0.31mm/px · z∈[-55,+35]mm · 7 of 22 slices shown (2 of 2)]
[im 1/22]
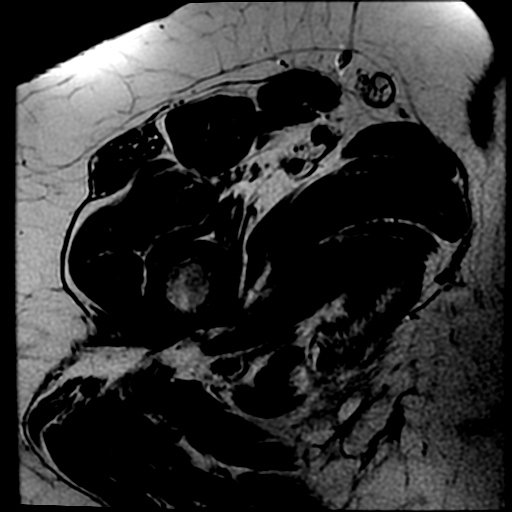
[im 3/22]
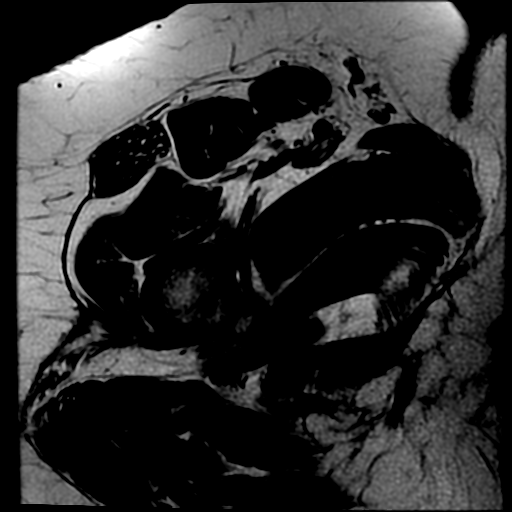
[im 8/22]
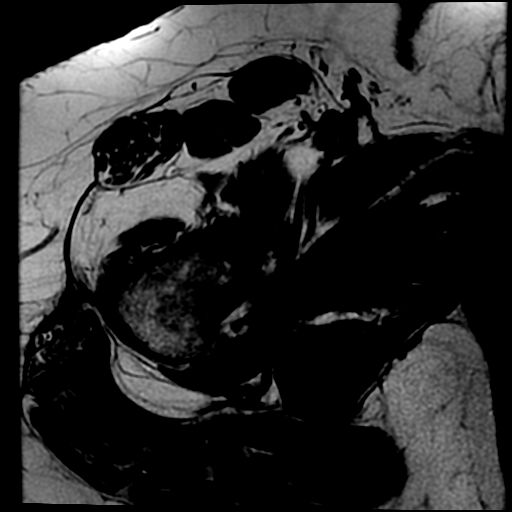
[im 10/22]
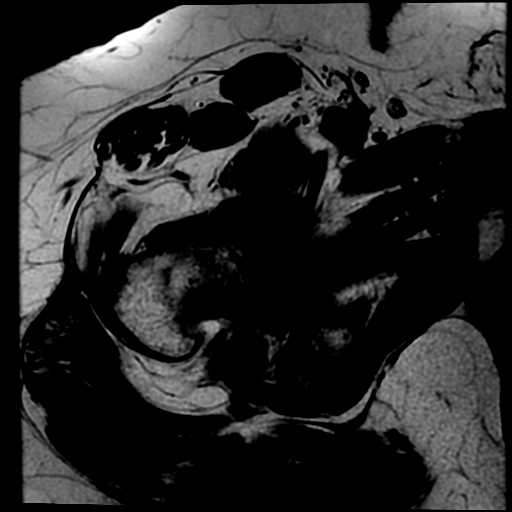
[im 12/22]
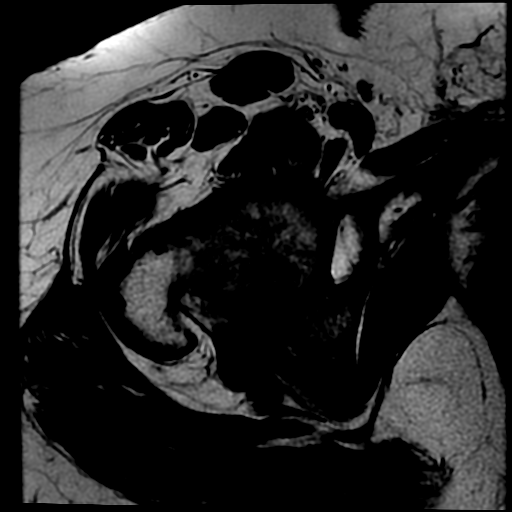
[im 15/22]
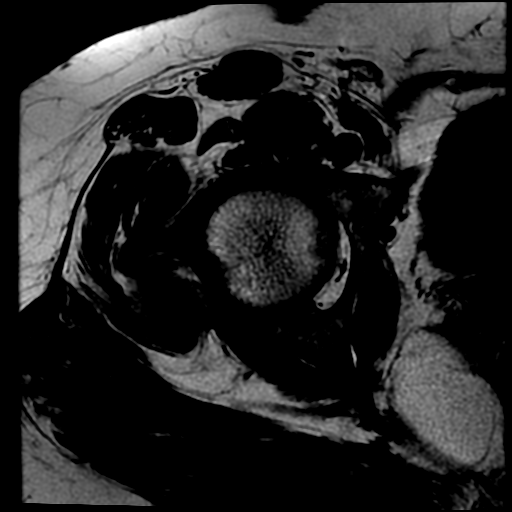
[im 19/22]
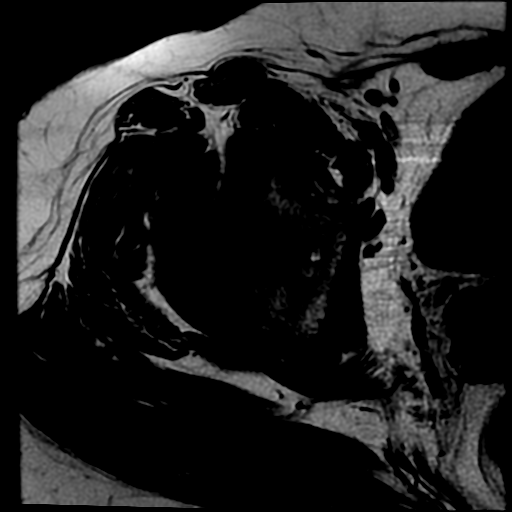

[19 of 48 positions shown; findings below may reference images not displayed]

RESULT:
Shallow acetabulum.
Remaining bone structures with preserved morphology and signal. There is no bony hypertrophy of the femoral head and neck.
No chondral fissures or erosions are characterized.
There is no joint effusion.
Acetabular labrum with morphology and signal within normality.
Intrinsic and extrinsic ligaments of the hip without significant abnormalities.
Periarticular muscle and tendon planes without abnormalities.
There is no evidence of bursitis or expansive formations.
Neurovascular bundles without significant changes.
CONCLUSION: Shallow acetabulum.


------------- REPORT [id number] -------------
MRI OF THE RIGHT HIP
RESULT:
Shallow acetabulum.
Remaining bone structures with preserved morphology and signal. There is no bone hypertrophy in the femoral head and neck.
No chondral fissures or erosions are characterized.
There is no joint effusion.
Acetabular labrum with morphology and signal within normal limits.
Intrinsic and extrinsic ligaments of the hip without significant abnormalities.
Periarticular muscle and tendon planes without abnormalities.
There is no evidence of bursitis or expansive formations.
Neurovascular bundles without significant alterations.
CONCLUSION: Shallow acetabulum.

## 2023-05-26 IMAGING — MR CERVCIAL
4 series · 18 of 48 positions shown · non-contrast
Comparison: none

------------- REPORT [id number] -------------
MRI OF THE CERVICAL SPINE
TECHNIQUE: FSE (fast-spin-echo) sequences, T1 and T2 weighted, were performed in the sagittal plane and T2 weighted sequences in the axial plane.

[Series 2: T2 · sagittal · 3.5mm · 0.49mm/px · 8 of 12 slices shown (1 of 3)]
[im 1/12]
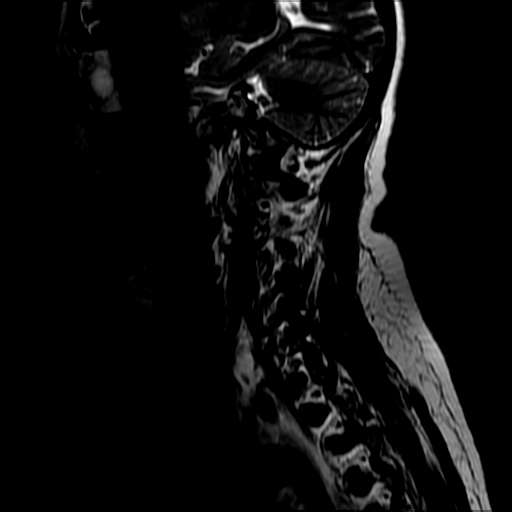
[im 2/12]
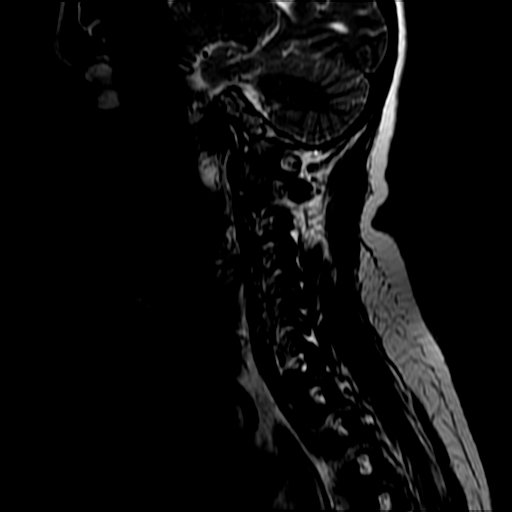
[im 4/12]
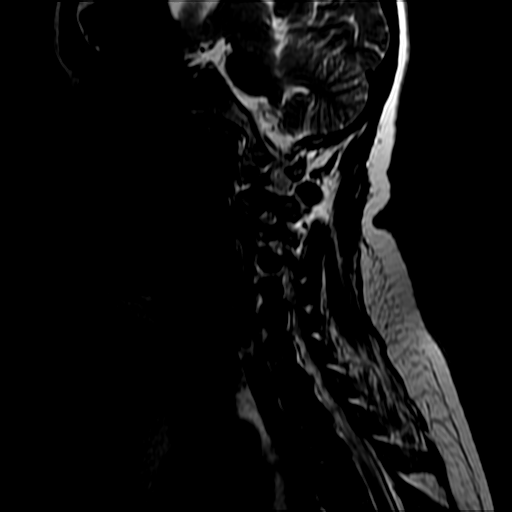
[im 5/12]
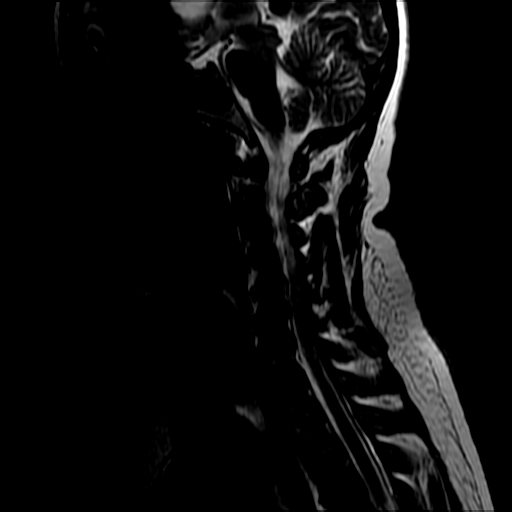
[im 7/12]
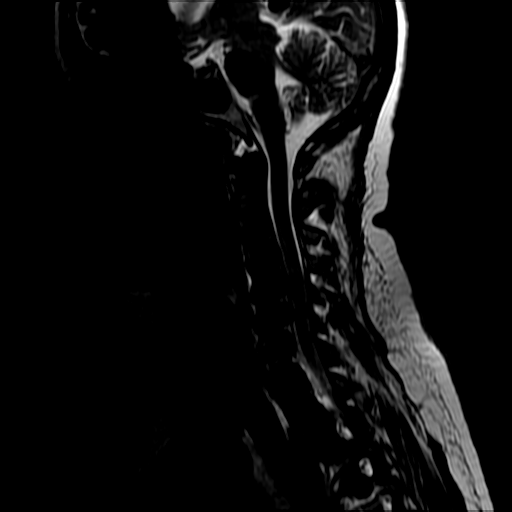
[im 8/12]
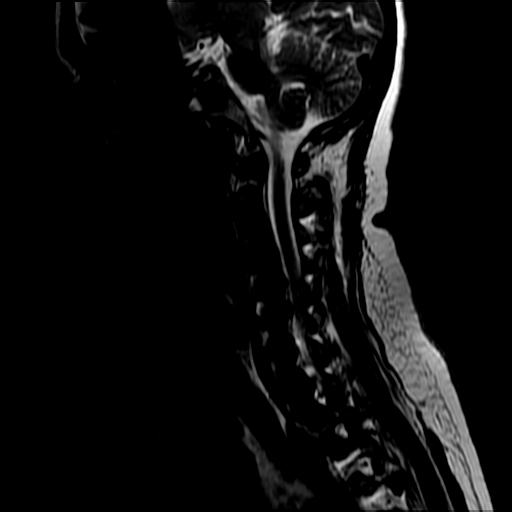
[im 10/12]
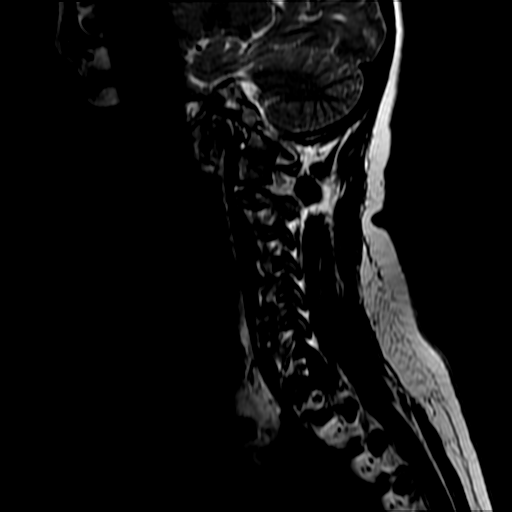
[im 12/12]
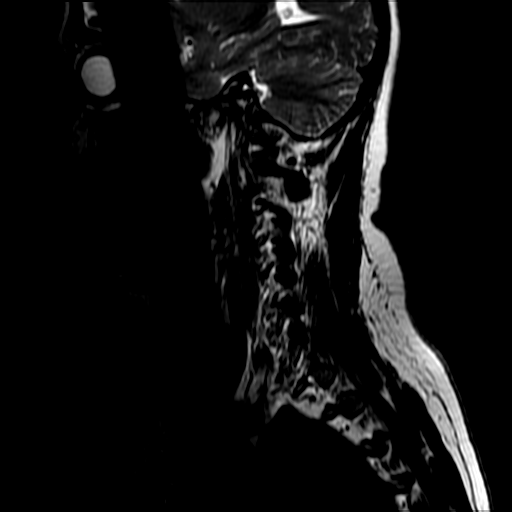

[Series 4: T1 · sagittal · 3.5mm · 0.49mm/px · 3 of 12 slices shown]
[im 2/12]
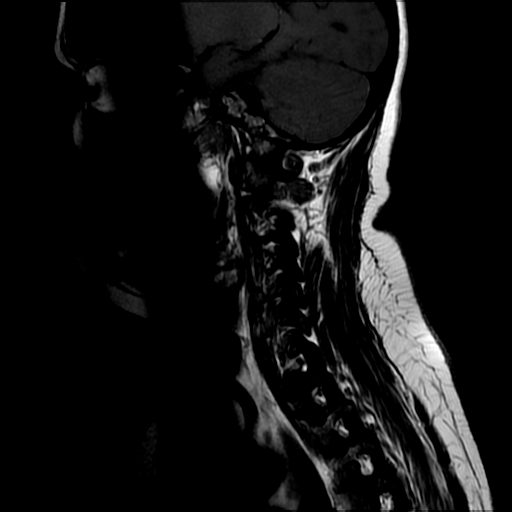
[im 7/12]
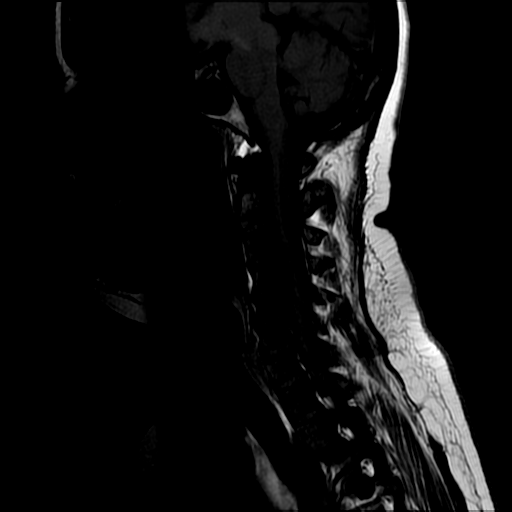
[im 10/12]
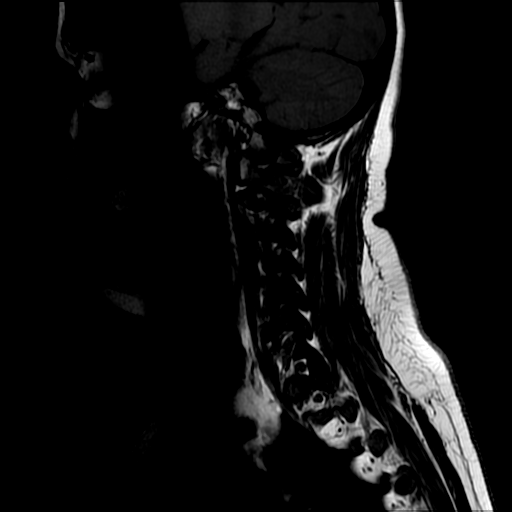

[Series 5: T2 · axial · 3.5mm · 0.37mm/px · z∈[-99,-29]mm · 4 of 24 slices shown (2 of 3)]
[im 2/24]
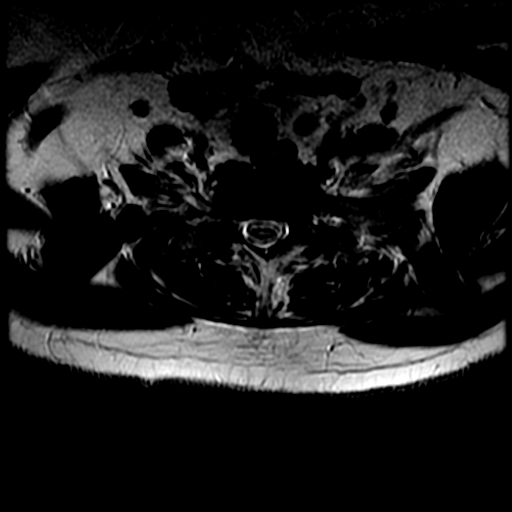
[im 4/24]
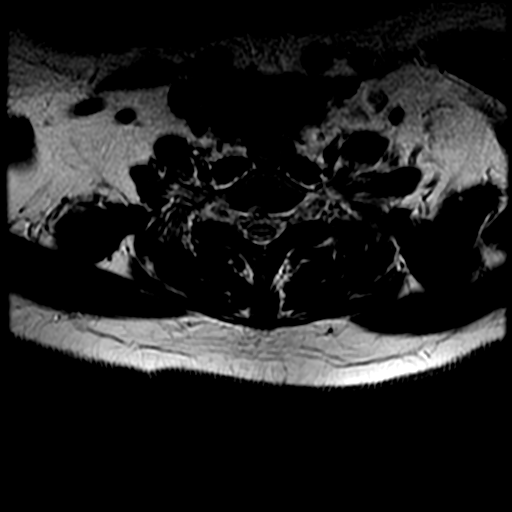
[im 13/24]
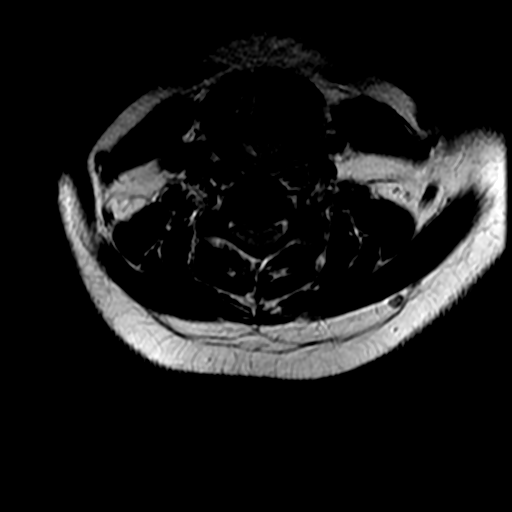
[im 20/24]
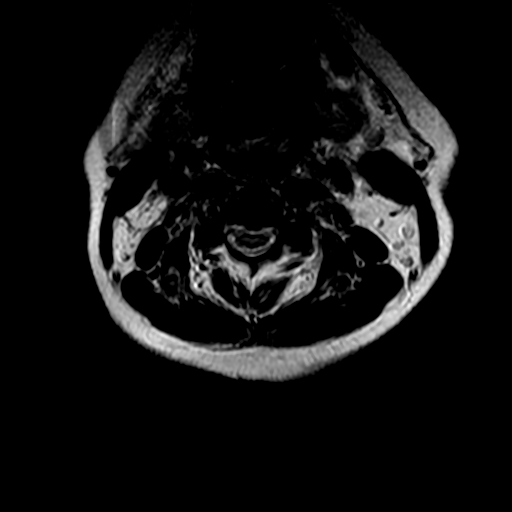

[Series 6: T2 · axial · 3.5mm · 0.37mm/px · z∈[-91,-29]mm · 3 of 24 slices shown (3 of 3)]
[im 4/24]
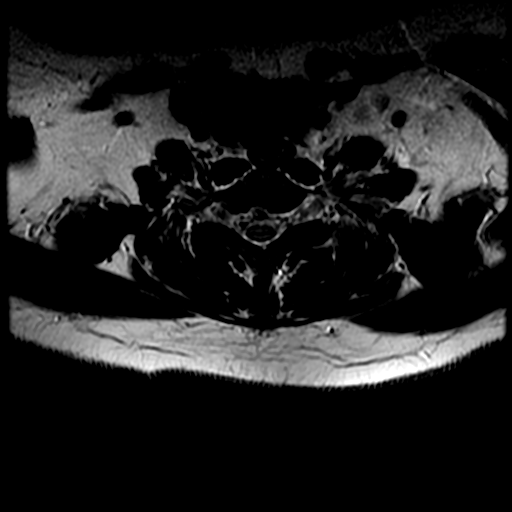
[im 13/24]
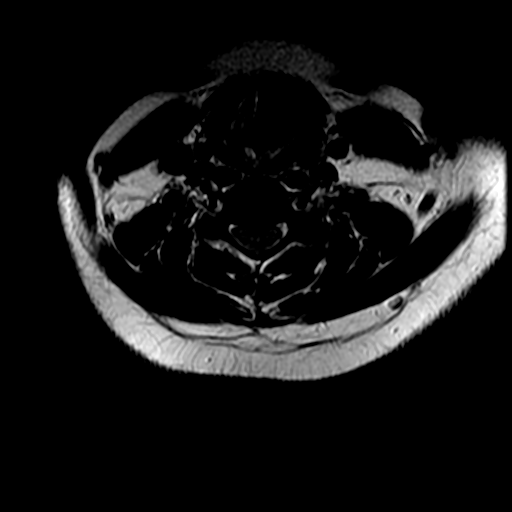
[im 20/24]
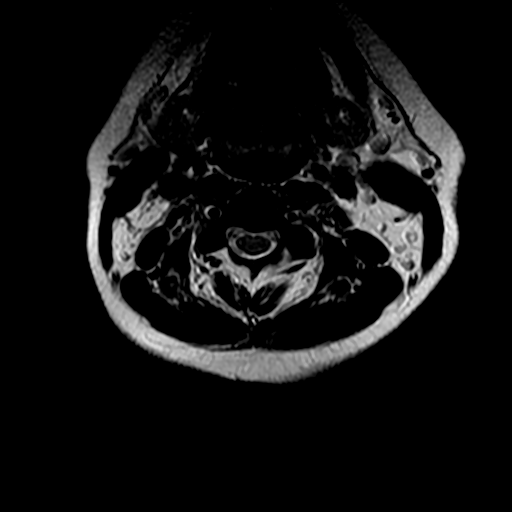

[18 of 48 positions shown; findings below may reference images not displayed]

RESULT:
Good alignment of the cervical vertebral bodies.
Straightening of the cervical lordosis in the supine position.
Osteophytosis of the cervical vertebral bodies.
Cervical vertebral bodies with preserved heights.
Pedicles, laminae, transverse and spinous processes preserved.
Uncovertebral and facet joints preserved.
Reduced signal of the intervertebral discs of C4-C5 and C5-C6, on T2-weighted images, consistent with disc dehydration.
Symmetrical disc-osteophyte complexes in C4-C5 and C5-C6, determining compression of the ventral aspect of the dural sac and reduction of the respective neural foramina.
Central posterior disc protrusion in C6-C7, straightening the ventral aspect of the dural sac.
Other intervertebral discs without significant changes.
Free cerebrospinal fluid space in the remaining segments.
Spinal cord with regular contours and homogeneous signal.
Vertebral canal and intervertebral foramina with preserved amplitude in the remaining segments.
Cranio-cervical junction without changes.
Paravertebral soft tissues with preserved appearance.
CONCLUSION: Cervical spondylosis.
Degenerative disc disease as described above.


------------- REPORT [id number] -------------
CERVICAL SPINE MAGNETIC RESONANCE IMAGING
RESULT:
Good alignment of the cervical vertebral bodies.
Straightening of the cervical lordosis in the supine position.
Osteophytosis of the cervical vertebral bodies.
Cervical vertebral bodies with preserved heights.
Pedicles, laminae, transverse and spinous processes preserved.
Uncovertebral and facet joints preserved.
Reduction of the signal from the intervertebral discs of C4-C5 and C5-C6, in T2-weighted images, consistent with disc dehydration.
Symmetrical disc-osteophyte complexes in C4-C5 and C5-C6, determining compression of the ventral aspect of the dural sac and reduction of the respective neural foramina.
Central posterior disc protrusion in C6-C7, straightening the ventral aspect of the dural sac.
Other intervertebral discs without significant changes.
Free cerebrospinal fluid space in the remaining segments.
Spinal cord with regular contours and homogeneous signal.
Vertebral canal and intervertebral foramina with preserved amplitude in the remaining segments.
Cranio-cervical junction without changes.
Paravertebral soft tissues with preserved appearance.
CONCLUSION: Cervical spondylosis.
Degenerative disc disease as described above.

## 2023-05-29 IMAGING — MR LOMBAR
4 series · 18 of 48 positions shown · non-contrast
Comparison: none

------------- REPORT [id number] -------------
LUMBOSACRAL SPINE MRI
TECHNIQUE: FSE (fast-spin-echo) sequences, T1- and T2-weighted, were performed in the sagittal and axial planes.
TECHNIQUE: FSE (fast-spin-echo) sequences, T1-weighted and T2-weighted in the sagittal and axial planes were performed.

[Series 3: T2 · sagittal · 4.5mm · 0.66mm/px · 9 of 12 slices shown (1 of 2)]
[im 1/12]
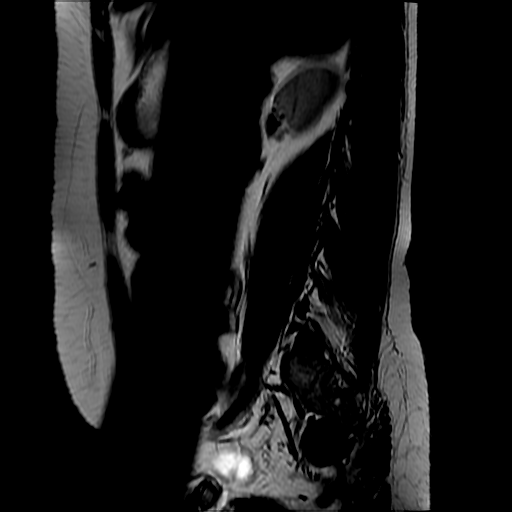
[im 2/12]
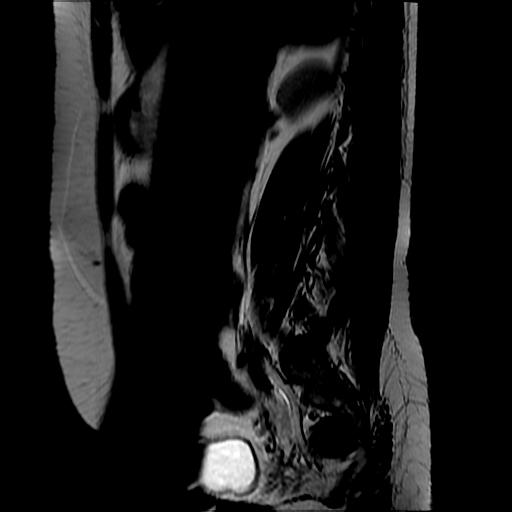
[im 3/12]
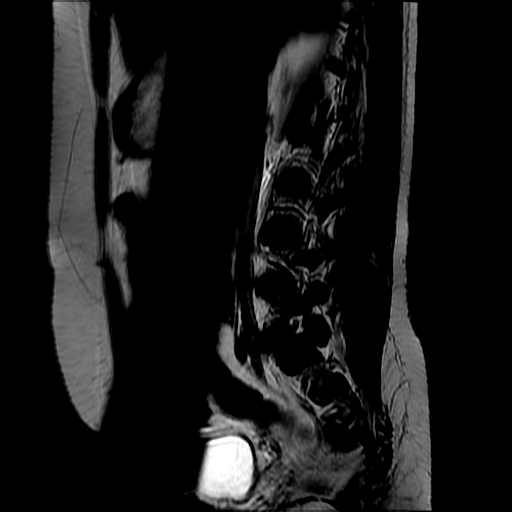
[im 5/12]
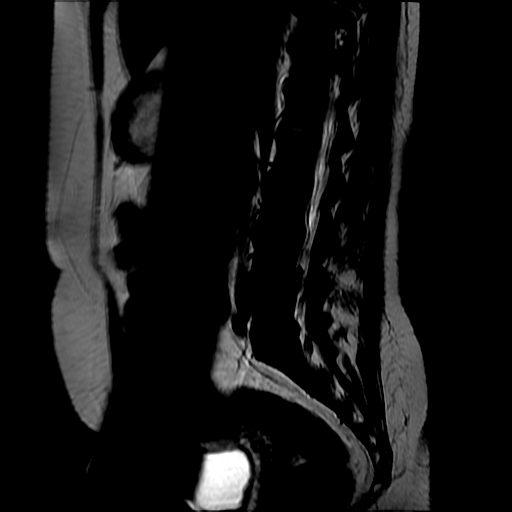
[im 6/12]
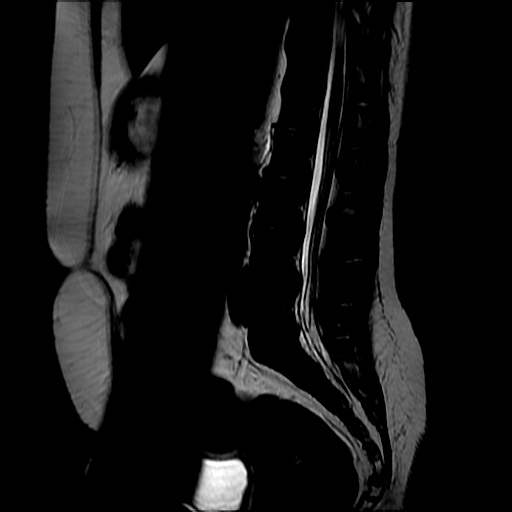
[im 7/12]
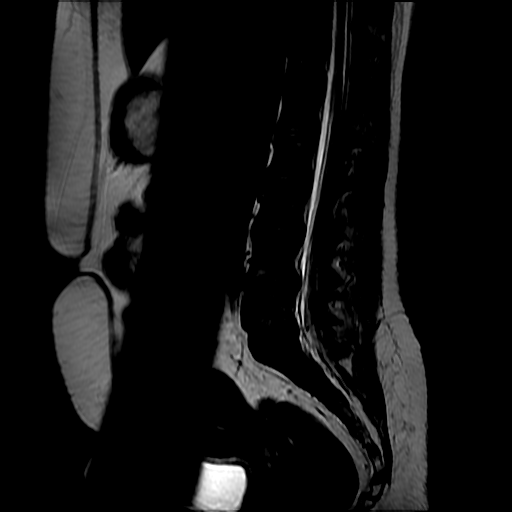
[im 9/12]
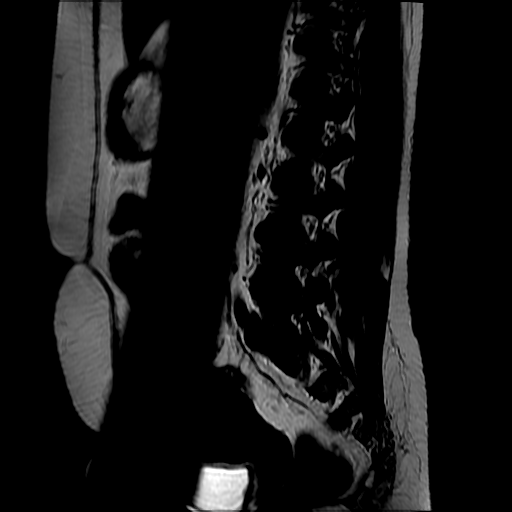
[im 10/12]
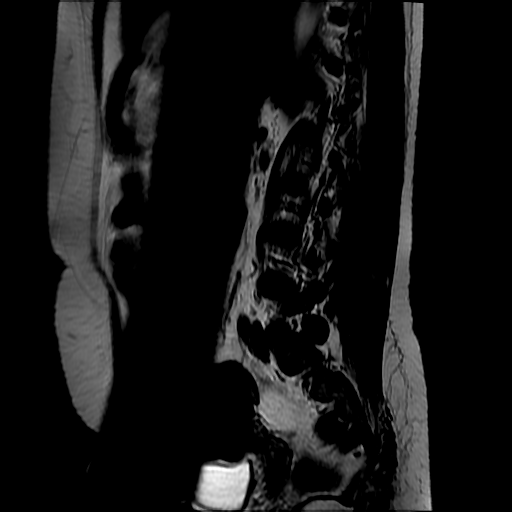
[im 12/12]
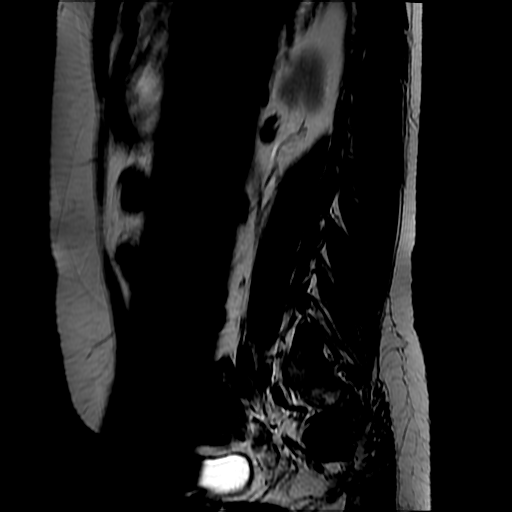

[Series 4: T1 · sagittal · 4.5mm · 0.66mm/px · 3 of 12 slices shown]
[im 2/12]
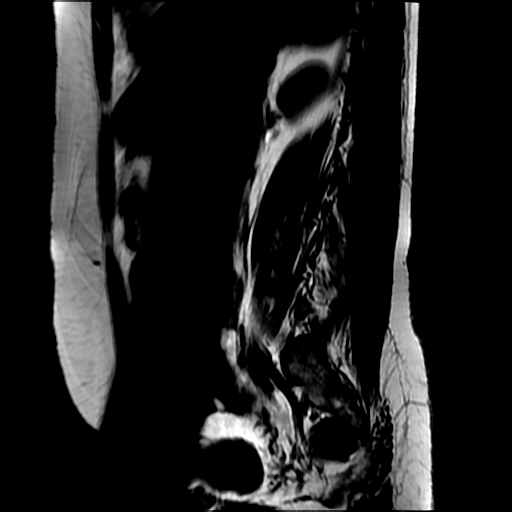
[im 7/12]
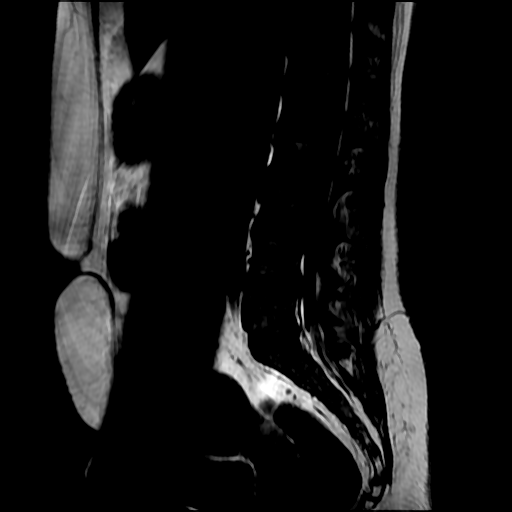
[im 10/12]
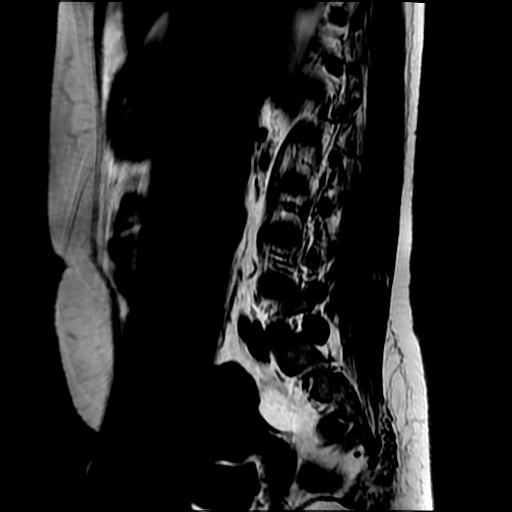

[Series 5: T2 fat-sat · sagittal · 4.5mm · 0.66mm/px · 3 of 12 slices shown]
[im 2/12]
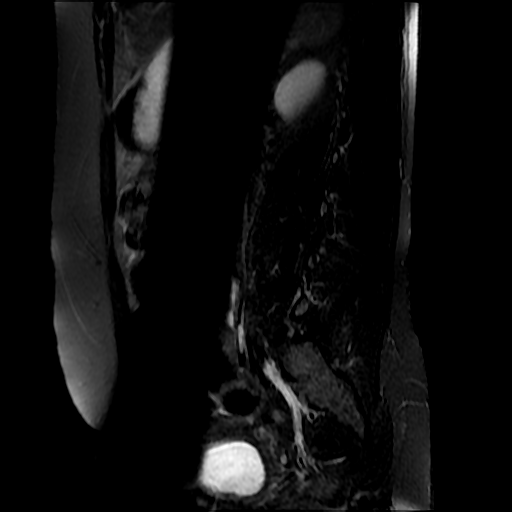
[im 7/12]
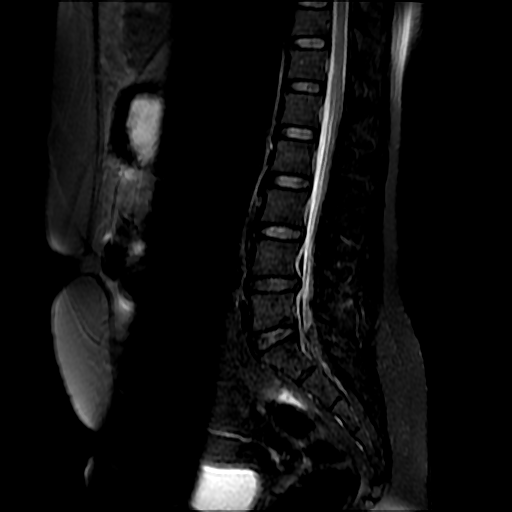
[im 10/12]
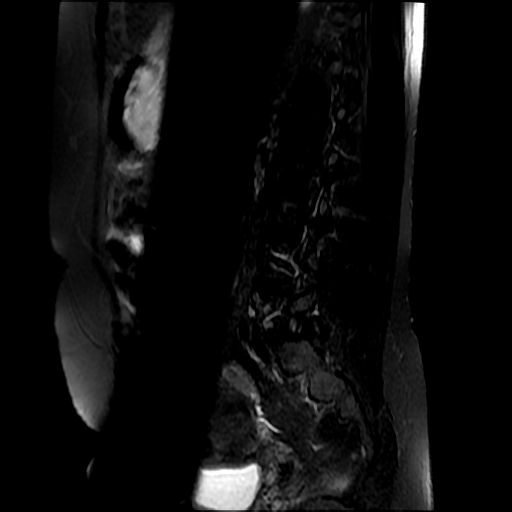

[Series 6: T2 · axial · 4.0mm · 0.43mm/px · z∈[-64,+80]mm · 3 of 24 slices shown (2 of 2)]
[im 4/24]
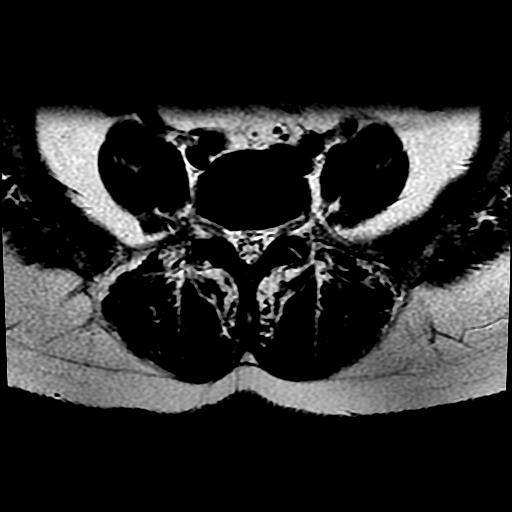
[im 12/24]
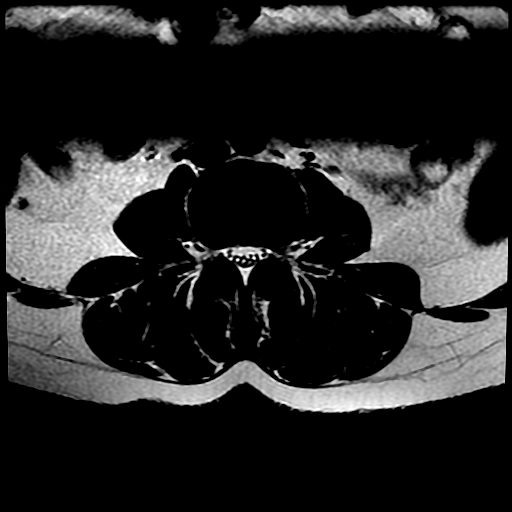
[im 20/24]
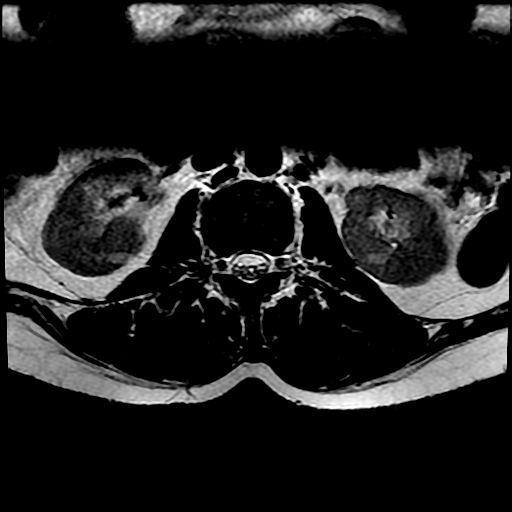

[18 of 48 positions shown; findings below may reference images not displayed]

RESULT:
Good alignment of the lumbar vertebral bodies.
Vertebral bodies with preserved heights.
Osteophytosis in the lumbar vertebral bodies.
Pedicles, laminae, transverse and spinous processes preserved.
Interapophyseal joints preserved.

L4-L5 Level: Symmetrical disc bulging presenting a fissure of the annulus fibrosus, straightening the ventral aspect of the dural sac, without radicular conflicts.
L5-S1 Level: Symmetrical disc bulging, straightening the ventral aspect of the dural sac, without radicular conflicts.

Remaining intervertebral discs without significant changes.
Vertebral canal and intervertebral foramina with preserved amplitude in the remaining segments.
Free cerebrospinal fluid space in the remaining segments.
Foraminal nerve roots preserved.
Conus medullaris with regular contours and homogeneous signal.
Paravertebral soft tissues with preserved appearance.
CONCLUSION: Lumbar spondylosis.
Degenerative disc disease as described above.


------------- REPORT [id number] -------------
LUMBOSACRAL SPINE MAGNETIC RESONANCE IMAGING
RESULT:
Good alignment of the lumbar vertebral bodies.
Vertebral bodies with preserved heights.
Osteophytosis in the lumbar vertebral bodies.
Pedicles, laminae, transverse and spinous processes preserved.
Interapophyseal joints preserved.

L4-L5 Level: Symmetrical disc bulging presenting a fissure of the annulus fibrosus, straightening the ventral aspect of the dural sac, without radicular conflicts.
L5-S1 Level: Symmetrical disc bulging, straightening the ventral aspect of the dural sac, without radicular conflicts.

Remaining intervertebral discs without significant changes.
Vertebral canal and intervertebral foramina with preserved amplitude in the remaining segments.
Free cerebrospinal fluid space in the remaining segments.
Preserved foraminal nerve roots.
Conus medullaris with regular contours and homogeneous signal.
Paravertebral soft tissues with preserved appearance.
CONCLUSION: Lumbar spondylosis.
Degenerative disc disease as described above.

## 2023-06-03 IMAGING — MR DORSAL
4 of 8 series · 18 of 48 positions shown · non-contrast
Comparison: none

------------- REPORT [id number] -------------
MRI OF THE THORACIC SPINE
TECHNIQUE: Examination performed using magnetic resonance imaging equipment with sequences, weightings, and specific planes for the segment of interest.
TECHNIQUE: Examination performed on magnetic resonance imaging equipment with sequences, weightings, and specific planes for the segment of interest.

[Series 4: T2 · coronal · 5.0mm · 0.68mm/px · 6 of 15 slices shown (1 of 3)]
[im 1/15]
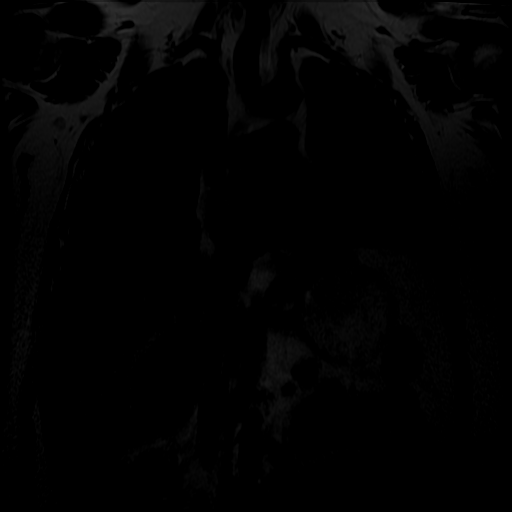
[im 3/15]
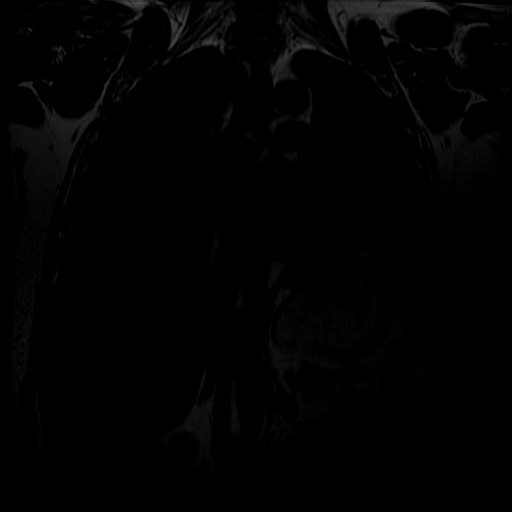
[im 6/15]
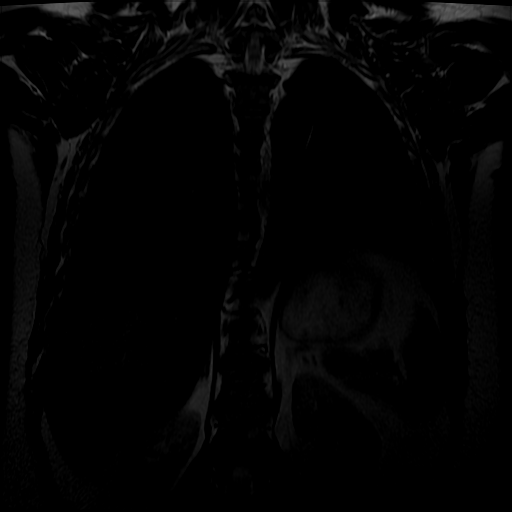
[im 9/15]
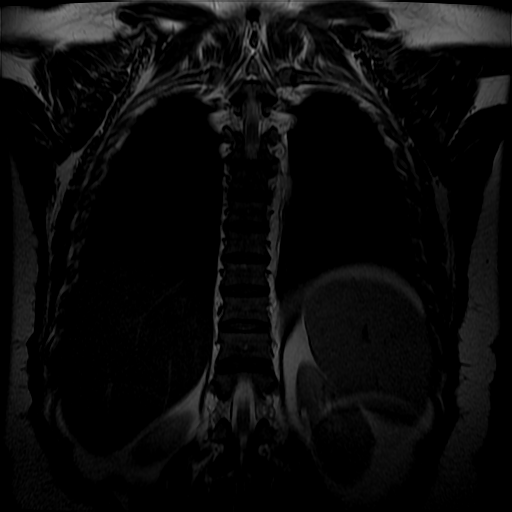
[im 12/15]
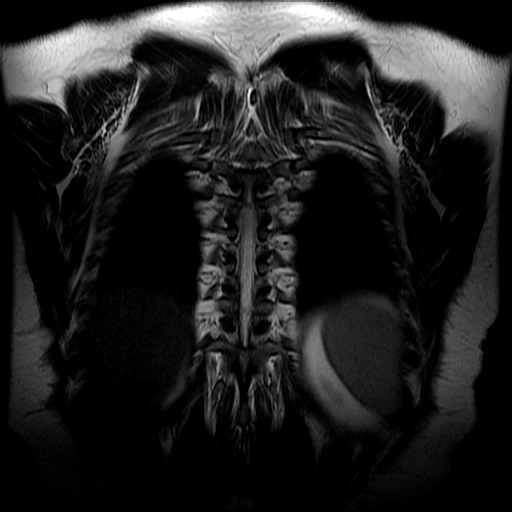
[im 15/15]
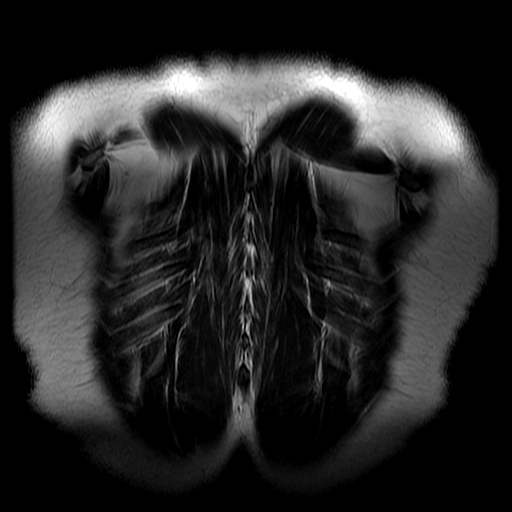

[Series 5: T2 · sagittal · 3.0mm · 0.68mm/px · 5 of 12 slices shown (2 of 3)]
[im 1/12]
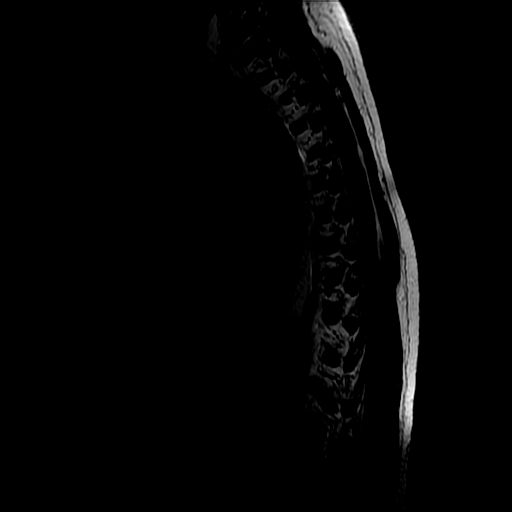
[im 3/12]
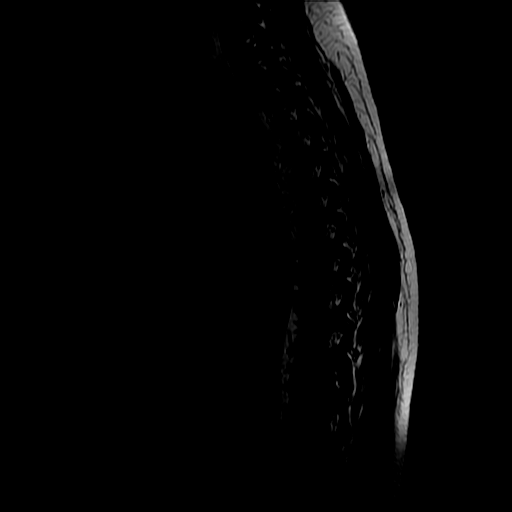
[im 6/12]
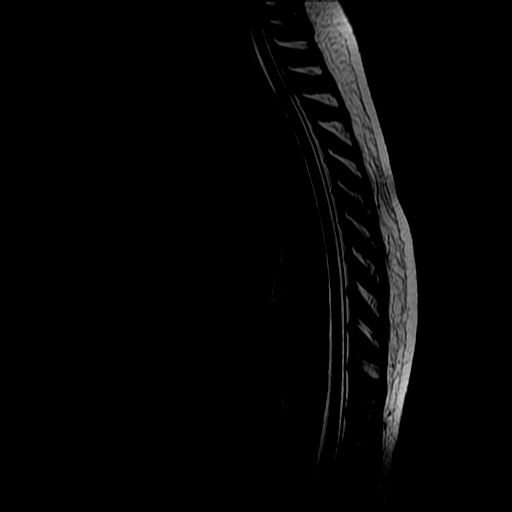
[im 9/12]
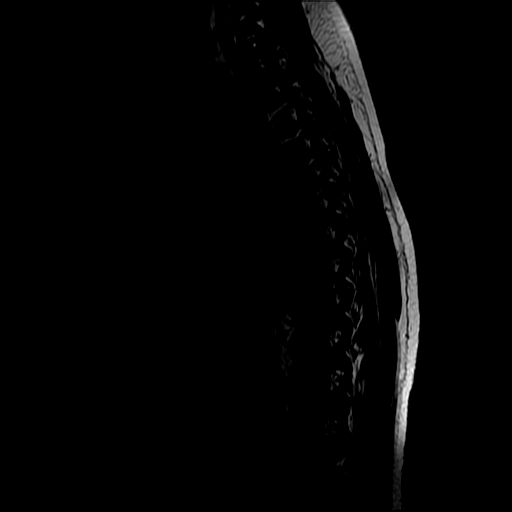
[im 12/12]
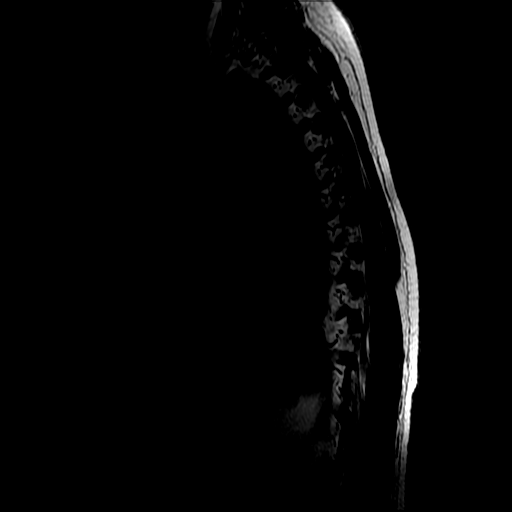

[Series 6: T1 · sagittal · 3.0mm · 0.68mm/px · 3 of 12 slices shown]
[im 1/12]
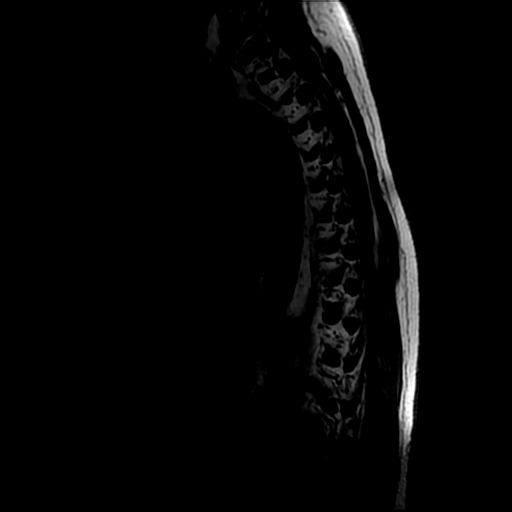
[im 6/12]
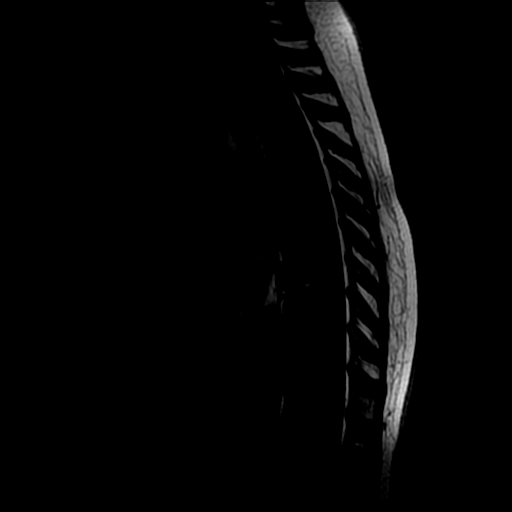
[im 12/12]
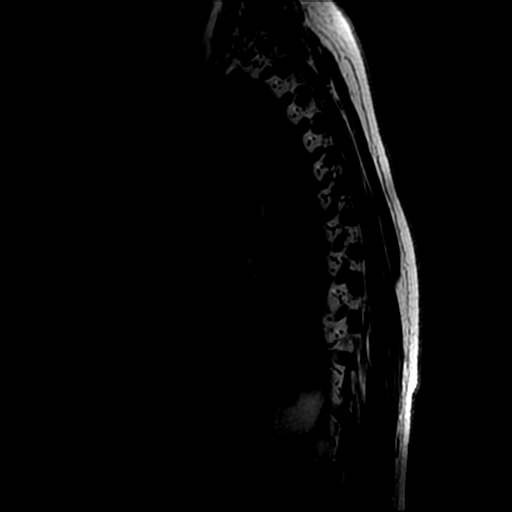

[Series 7: T2 · axial · 4.0mm · 0.39mm/px · z∈[-277,-178]mm · 4 of 24 slices shown (3 of 3)]
[im 1/24]
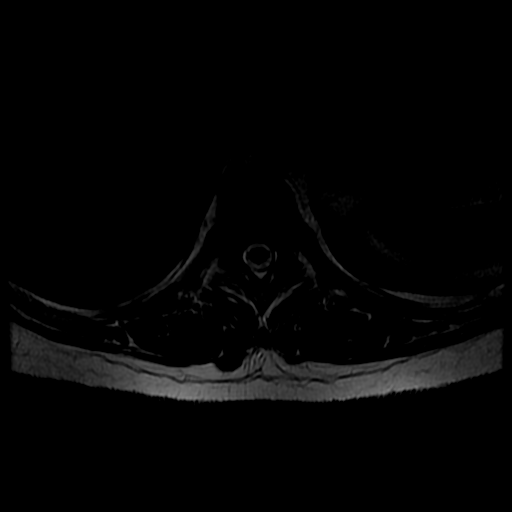
[im 3/24]
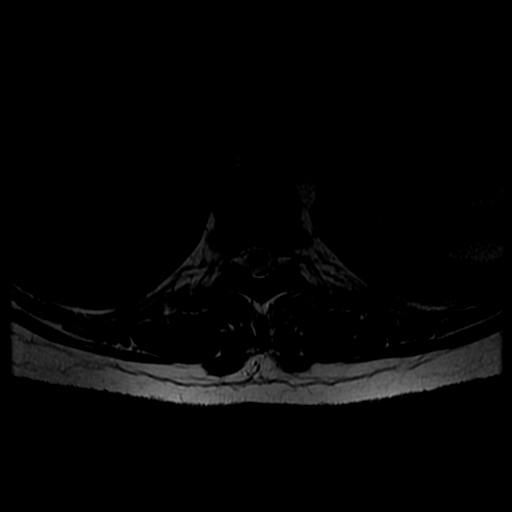
[im 13/24]
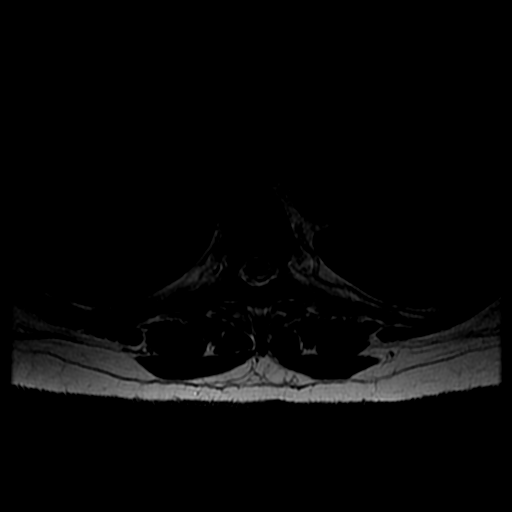
[im 21/24]
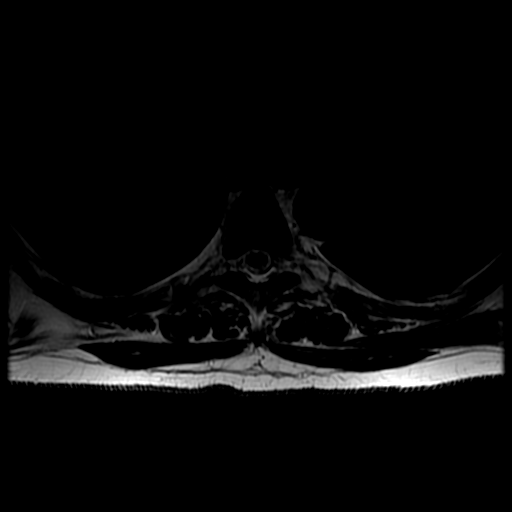

[18 of 48 positions shown; findings below may reference images not displayed]

RESULT:
Good alignment of the thoracic vertebral bodies.
Accentuation of thoracic kyphosis in the supine position.
Vertebral bodies with preserved heights.
Osteophytosis in the thoracic vertebral bodies.
Pedicles, laminae, transverse and spinous processes preserved.
Interapophyseal joints preserved.
Focus of hypersignal in T1 and T2 in the vertebral body of D9, possibly corresponding to hemangioma or focal fat deposition.
Intervertebral discs without significant changes.
Vertebral canal and intervertebral foramina with preserved amplitude.
Free cerebrospinal fluid space.
Preserved foraminal nerve roots.
Spinal cord with regular contours and homogeneous signal.
Paravertebral soft tissues with preserved appearance.
CONCLUSION: Thoracic spondylosis.


------------- REPORT [id number] -------------
MRI OF THE THORACIC SPINE
RESULT:
Good alignment of the thoracic vertebral bodies.
Accentuation of thoracic kyphosis in the supine position.
Vertebral bodies with preserved heights.
Osteophytosis in the thoracic vertebral bodies.
Pedicles, laminae, transverse and spinous processes preserved.
Interapophyseal joints preserved.
Focus of hypersignal in T1 and T2 in the vertebral body of T9, which may correspond to hemangioma or even focal fatty deposition.
Intervertebral discs without significant changes.
Vertebral canal and intervertebral foramina with preserved amplitude.
Free cerebrospinal fluid space.
Foraminal nerve roots preserved.
Medulla with regular contours and homogeneous signal.
Paravertebral soft tissues with preserved appearance.
CONCLUSION: Thoracic spondylosis.

## 2024-02-12 ENCOUNTER — Other Ambulatory Visit: Payer: Self-pay | Admitting: Student in an Organized Health Care Education/Training Program
